# Patient Record
Sex: Female | Born: 1985 | Race: Black or African American | Hispanic: No | Marital: Single | Smoking: Never smoker
Health system: Southern US, Community
[De-identification: ages and names within clinical notes are randomized; demographics above are authoritative.]

## PROBLEM LIST (undated history)

## (undated) DIAGNOSIS — I10 Essential (primary) hypertension: Secondary | ICD-10-CM

## (undated) DIAGNOSIS — N390 Urinary tract infection, site not specified: Secondary | ICD-10-CM

## (undated) DIAGNOSIS — L309 Dermatitis, unspecified: Secondary | ICD-10-CM

## (undated) HISTORY — PX: FOOT SURGERY: SHX648

---

## 2004-05-11 HISTORY — PX: WISDOM TOOTH EXTRACTION: SHX21

## 2008-03-03 ENCOUNTER — Emergency Department (HOSPITAL_COMMUNITY): Admission: EM | Admit: 2008-03-03 | Discharge: 2008-03-03 | Payer: Self-pay | Admitting: Emergency Medicine

## 2011-02-10 LAB — DIFFERENTIAL
Basophils Absolute: 0.1
Lymphocytes Relative: 25
Neutro Abs: 5.8

## 2011-02-10 LAB — URINALYSIS, ROUTINE W REFLEX MICROSCOPIC
Leukocytes, UA: NEGATIVE
Nitrite: NEGATIVE
Specific Gravity, Urine: 1.04 — ABNORMAL HIGH
pH: 5.5

## 2011-02-10 LAB — CBC
Hemoglobin: 12.9
Platelets: 287
RDW: 13.8

## 2011-02-10 LAB — URINE MICROSCOPIC-ADD ON

## 2012-07-02 ENCOUNTER — Encounter (HOSPITAL_BASED_OUTPATIENT_CLINIC_OR_DEPARTMENT_OTHER): Payer: Self-pay | Admitting: Emergency Medicine

## 2012-07-02 ENCOUNTER — Emergency Department (HOSPITAL_BASED_OUTPATIENT_CLINIC_OR_DEPARTMENT_OTHER)
Admission: EM | Admit: 2012-07-02 | Discharge: 2012-07-02 | Disposition: A | Discharge status: Home / Self Care | Payer: 59 | Attending: Emergency Medicine | Admitting: Emergency Medicine

## 2012-07-02 DIAGNOSIS — R350 Frequency of micturition: Secondary | ICD-10-CM | POA: Insufficient documentation

## 2012-07-02 DIAGNOSIS — R109 Unspecified abdominal pain: Secondary | ICD-10-CM | POA: Insufficient documentation

## 2012-07-02 DIAGNOSIS — Z3202 Encounter for pregnancy test, result negative: Secondary | ICD-10-CM | POA: Insufficient documentation

## 2012-07-02 DIAGNOSIS — N390 Urinary tract infection, site not specified: Secondary | ICD-10-CM | POA: Insufficient documentation

## 2012-07-02 LAB — URINALYSIS, ROUTINE W REFLEX MICROSCOPIC
Bilirubin Urine: NEGATIVE
Glucose, UA: NEGATIVE mg/dL
Ketones, ur: 15 mg/dL — AB
pH: 5.5 (ref 5.0–8.0)

## 2012-07-02 LAB — URINE MICROSCOPIC-ADD ON

## 2012-07-02 MED ORDER — HYDROCODONE-ACETAMINOPHEN 5-325 MG PO TABS
2.0000 | ORAL_TABLET | Freq: Once | ORAL | Status: AC
  Administered 2012-07-02: 2 via ORAL
  Filled 2012-07-02: qty 2

## 2012-07-02 MED ORDER — CIPROFLOXACIN HCL 500 MG PO TABS: 500.0000 mg | ORAL_TABLET | Freq: Two times a day (BID) | ORAL | Status: DC

## 2012-07-02 MED ORDER — PHENAZOPYRIDINE HCL 200 MG PO TABS: 200.0000 mg | ORAL_TABLET | Freq: Three times a day (TID) | ORAL | Status: DC

## 2012-07-02 MED ORDER — HYDROCODONE-ACETAMINOPHEN 5-325 MG PO TABS: 2.0000 | ORAL_TABLET | Freq: Four times a day (QID) | ORAL | Status: DC | PRN

## 2012-07-02 NOTE — ED Provider Notes (Signed)
History     CSN: [id number]  Arrival date & time 07/02/12  1139   First MD Initiated Contact with Patient 07/02/12 1150      Chief Complaint  Patient presents with  . Dysuria  . Flank Pain    (Consider location/radiation/quality/duration/timing/severity/associated sxs/prior treatment) Patient is a 27 y.o. female presenting with dysuria. The history is provided by the patient.  Dysuria  This is a new problem. The current episode started 2 days ago. The problem occurs every urination. The problem has been gradually worsening. The quality of the pain is described as burning (frequency). The pain is moderate. There has been no fever. There is no history of pyelonephritis. Associated symptoms include frequency. Pertinent negatives include no chills, no vomiting, no discharge and no possible pregnancy. Treatments tried: azo otc.    No past medical history on file.  Past Surgical History  Procedure Laterality Date  . Foot surgery      No family history on file.  History  Substance Use Topics  . Smoking status: Not on file  . Smokeless tobacco: Not on file  . Alcohol Use: Not on file    OB History   Grav Para Term Preterm Abortions TAB SAB Ect Mult Living                  Review of Systems  Constitutional: Negative for chills.  Gastrointestinal: Negative for vomiting.  Genitourinary: Positive for dysuria and frequency.  All other systems reviewed and are negative.    Allergies  Penicillins  Home Medications   Current Outpatient Rx  Name  Route  Sig  Dispense  Refill  . phenazopyridine (PYRIDIUM) 95 MG tablet   Oral   Take 95 mg by mouth 3 (three) times daily as needed for pain.           BP 165/94  Pulse 95  Temp(Src) 97.9 F (36.6 C) (Oral)  Resp 18  Ht 5\' 5"  (1.651 m)  Wt 200 lb (90.719 kg)  BMI 33.28 kg/m2  SpO2 100%  LMP 06/20/2012  Physical Exam  Nursing note and vitals reviewed. Constitutional: She is oriented to person, place, and time.  She appears well-developed and well-nourished. No distress.  HENT:  Head: Normocephalic and atraumatic.  Mouth/Throat: Oropharynx is clear and moist.  Neck: Normal range of motion. Neck supple.  Cardiovascular: Normal rate and regular rhythm.   No murmur heard. Pulmonary/Chest: Effort normal and breath sounds normal. No respiratory distress. She has no wheezes.  Abdominal: Soft. Bowel sounds are normal. She exhibits no distension. There is no tenderness.  Musculoskeletal: Normal range of motion.  Lymphadenopathy:    She has no cervical adenopathy.  Neurological: She is alert and oriented to person, place, and time.  Skin: Skin is warm and dry. She is not diaphoretic.    ED Course  Procedures (including critical care time)  Labs Reviewed  URINALYSIS, ROUTINE W REFLEX MICROSCOPIC  PREGNANCY, URINE   No results found.   No diagnosis found.    MDM  The ua shows a uti.   This is consistent with the clinical picture.  Will discharge with cipro, return prn.        Geoffery Lyons, MD 07/02/12 1310

## 2012-07-02 NOTE — ED Notes (Signed)
Pt having some dysuria since Thursday.  Started taking azo-standard.  Symptoms relieved but is now having right flank pain.  No known fever.

## 2012-07-04 ENCOUNTER — Emergency Department (HOSPITAL_BASED_OUTPATIENT_CLINIC_OR_DEPARTMENT_OTHER)
Admission: EM | Admit: 2012-07-04 | Discharge: 2012-07-04 | Disposition: A | Discharge status: Home / Self Care | Payer: 59 | Attending: Emergency Medicine | Admitting: Emergency Medicine

## 2012-07-04 ENCOUNTER — Emergency Department (HOSPITAL_BASED_OUTPATIENT_CLINIC_OR_DEPARTMENT_OTHER): Payer: 59

## 2012-07-04 ENCOUNTER — Encounter (HOSPITAL_BASED_OUTPATIENT_CLINIC_OR_DEPARTMENT_OTHER): Payer: Self-pay | Admitting: Emergency Medicine

## 2012-07-04 DIAGNOSIS — Z79899 Other long term (current) drug therapy: Secondary | ICD-10-CM | POA: Insufficient documentation

## 2012-07-04 DIAGNOSIS — R109 Unspecified abdominal pain: Secondary | ICD-10-CM | POA: Insufficient documentation

## 2012-07-04 DIAGNOSIS — N760 Acute vaginitis: Secondary | ICD-10-CM | POA: Insufficient documentation

## 2012-07-04 DIAGNOSIS — A499 Bacterial infection, unspecified: Secondary | ICD-10-CM | POA: Insufficient documentation

## 2012-07-04 DIAGNOSIS — I10 Essential (primary) hypertension: Secondary | ICD-10-CM | POA: Insufficient documentation

## 2012-07-04 DIAGNOSIS — Z3202 Encounter for pregnancy test, result negative: Secondary | ICD-10-CM | POA: Insufficient documentation

## 2012-07-04 DIAGNOSIS — D259 Leiomyoma of uterus, unspecified: Secondary | ICD-10-CM | POA: Insufficient documentation

## 2012-07-04 DIAGNOSIS — N390 Urinary tract infection, site not specified: Secondary | ICD-10-CM | POA: Insufficient documentation

## 2012-07-04 DIAGNOSIS — Z872 Personal history of diseases of the skin and subcutaneous tissue: Secondary | ICD-10-CM | POA: Insufficient documentation

## 2012-07-04 DIAGNOSIS — B9689 Other specified bacterial agents as the cause of diseases classified elsewhere: Secondary | ICD-10-CM | POA: Insufficient documentation

## 2012-07-04 DIAGNOSIS — D219 Benign neoplasm of connective and other soft tissue, unspecified: Secondary | ICD-10-CM

## 2012-07-04 HISTORY — DX: Dermatitis, unspecified: L30.9

## 2012-07-04 HISTORY — DX: Essential (primary) hypertension: I10

## 2012-07-04 HISTORY — DX: Urinary tract infection, site not specified: N39.0

## 2012-07-04 LAB — WET PREP, GENITAL: Yeast Wet Prep HPF POC: NONE SEEN

## 2012-07-04 LAB — URINALYSIS, ROUTINE W REFLEX MICROSCOPIC
Leukocytes, UA: NEGATIVE
Nitrite: NEGATIVE
Specific Gravity, Urine: 1.011 (ref 1.005–1.030)
Urobilinogen, UA: 1 mg/dL (ref 0.0–1.0)

## 2012-07-04 LAB — URINE CULTURE

## 2012-07-04 LAB — PREGNANCY, URINE: Preg Test, Ur: NEGATIVE

## 2012-07-04 LAB — URINE MICROSCOPIC-ADD ON

## 2012-07-04 MED ORDER — IBUPROFEN 800 MG PO TABS: 800.0000 mg | ORAL_TABLET | Freq: Three times a day (TID) | ORAL | Status: DC

## 2012-07-04 MED ORDER — METRONIDAZOLE 500 MG PO TABS: 500.0000 mg | ORAL_TABLET | Freq: Two times a day (BID) | ORAL | Status: DC

## 2012-07-04 MED ORDER — HYDROCODONE-ACETAMINOPHEN 5-325 MG PO TABS: 2.0000 | ORAL_TABLET | ORAL | Status: DC | PRN

## 2012-07-04 NOTE — ED Notes (Signed)
Pt woke up at 7am and noticed vaginal spotting.  Got worse today until she was passing large clots. Using 1 pad per hour.  Had period 2 weeks ago. Cramps on left - worse than normal period cramps.

## 2012-07-04 NOTE — ED Provider Notes (Signed)
History     CSN: [id number]  Arrival date & time 07/04/12  1644   First MD Initiated Contact with Patient 07/04/12 1716      Chief Complaint  Patient presents with  . Vaginal Bleeding    (Consider location/radiation/quality/duration/timing/severity/associated sxs/prior treatment) Patient is a 27 y.o. female presenting with vaginal bleeding. The history is provided by the patient. No language interpreter was used.  Vaginal Bleeding This is a new problem. The current episode started today. The problem occurs constantly. The problem has been gradually worsening. Associated symptoms include abdominal pain. Nothing aggravates the symptoms.   Pt complains of lower abdominal cramping.   Pt reports her period was 2 weeks ago,   Pt reports she has been passing cramps today.  Pt reports period is not usually this heavy.   Pt denies any std risk Past Medical History  Diagnosis Date  . Hypertension   . UTI (lower urinary tract infection)   . Eczema     Past Surgical History  Procedure Laterality Date  . Foot surgery    . Foot surgery      No family history on file.  History  Substance Use Topics  . Smoking status: Never Smoker   . Smokeless tobacco: Not on file  . Alcohol Use: Yes     Comment: occ    OB History   Grav Para Term Preterm Abortions TAB SAB Ect Mult Living                  Review of Systems  Gastrointestinal: Positive for abdominal pain.  Genitourinary: Positive for vaginal bleeding.  All other systems reviewed and are negative.    Allergies  Penicillins  Home Medications   Current Outpatient Rx  Name  Route  Sig  Dispense  Refill  . norgestimate-ethinyl estradiol (ORTHO-CYCLEN,SPRINTEC,PREVIFEM) 0.25-35 MG-MCG tablet   Oral   Take 1 tablet by mouth daily.         . ciprofloxacin (CIPRO) 500 MG tablet   Oral   Take 1 tablet (500 mg total) by mouth every 12 (twelve) hours.   10 tablet   0   . HYDROcodone-acetaminophen (NORCO/VICODIN) 5-325 MG  per tablet   Oral   Take 2 tablets by mouth every 6 (six) hours as needed for pain.   12 tablet   0   . phenazopyridine (PYRIDIUM) 200 MG tablet   Oral   Take 1 tablet (200 mg total) by mouth 3 (three) times daily.   6 tablet   0   . phenazopyridine (PYRIDIUM) 95 MG tablet   Oral   Take 95 mg by mouth 3 (three) times daily as needed for pain.           BP 165/89  Pulse 94  Temp(Src) 98.2 F (36.8 C) (Oral)  Resp 16  Ht 5\' 5"  (1.651 m)  Wt 200 lb (90.719 kg)  BMI 33.28 kg/m2  SpO2 100%  LMP 06/22/2012  Physical Exam  Nursing note and vitals reviewed. Constitutional: She appears well-developed and well-nourished.  HENT:  Head: Normocephalic.  Right Ear: External ear normal.  Left Ear: External ear normal.  Nose: Nose normal.  Mouth/Throat: Oropharynx is clear and moist.  Eyes: Conjunctivae and EOM are normal. Pupils are equal, round, and reactive to light.  Neck: Normal range of motion. Neck supple.  Cardiovascular: Normal rate.   Pulmonary/Chest: Effort normal.  Abdominal: Soft.  Musculoskeletal: Normal range of motion.  Neurological: She is alert.  Skin: Skin is warm.  ED Course  Procedures (including critical care time)  Labs Reviewed  WET PREP, GENITAL - Abnormal; Notable for the following:    Clue Cells Wet Prep HPF POC FEW (*)    WBC, Wet Prep HPF POC RARE (*)    All other components within normal limits  URINALYSIS, ROUTINE W REFLEX MICROSCOPIC - Abnormal; Notable for the following:    Color, Urine AMBER (*)    Hgb urine dipstick LARGE (*)    All other components within normal limits  GC/CHLAMYDIA PROBE AMP  PREGNANCY, URINE  URINE MICROSCOPIC-ADD ON   US Transvaginal Non-ob  07/04/2012  *RADIOLOGY REPORT*  Clinical Data: Cramping and bleeding between periods, on birth control, LMP 06/24/2012  TRANSABDOMINAL AND TRANSVAGINAL ULTRASOUND OF PELVIS Technique:  Both transabdominal and transvaginal ultrasound examinations of the pelvis were  performed. Transabdominal technique was performed for global imaging of the pelvis including uterus, ovaries, adnexal regions, and pelvic cul-de-sac.  It was necessary to proceed with endovaginal exam following the transabdominal exam to visualize the endometrium and ovaries.  Comparison:  None  Findings:  Uterus: 8.1 cm length by 4.8 cm AP by 5.0 cm transverse.  Normal morphology with question of a smallsubserosal leiomyoma at the left lateral aspect of the uterus measuring 15 x 10 x 16 mm.  Endometrium: 2 mm thick, normal.  No endometrial fluid.  Right ovary:  3.5 x 1.3 x 1.6 cm.  Normal morphology without mass.  Left ovary: 2.1 x 1.5 x 1.3 cm.  Tiny left ovarian versus paraovarian cyst 8 x 8 x 8 mm.  Other findings: Trace free pelvic fluid.  A small nonspecific cyst is seen to the left posterior of the cervix, uncertain etiology, 2.4 x 1.4 x 2.0 cm.  IMPRESSION:  Probable tiny subserosal leiomyoma at left lateral aspect of uterus 16 mm greatest size. Unremarkable ovaries. Small cyst posterior to the cervix, 2.4 cm greatest size, nonspecific. Remainder of exam unremarkable.   Original Report Authenticated By: Ulyses Southward, M.D.    US Pelvis Complete  07/04/2012  *RADIOLOGY REPORT*  Clinical Data: Cramping and bleeding between periods, on birth control, LMP 06/24/2012  TRANSABDOMINAL AND TRANSVAGINAL ULTRASOUND OF PELVIS Technique:  Both transabdominal and transvaginal ultrasound examinations of the pelvis were performed. Transabdominal technique was performed for global imaging of the pelvis including uterus, ovaries, adnexal regions, and pelvic cul-de-sac.  It was necessary to proceed with endovaginal exam following the transabdominal exam to visualize the endometrium and ovaries.  Comparison:  None  Findings:  Uterus: 8.1 cm length by 4.8 cm AP by 5.0 cm transverse.  Normal morphology with question of a smallsubserosal leiomyoma at the left lateral aspect of the uterus measuring 15 x 10 x 16 mm.  Endometrium: 2  mm thick, normal.  No endometrial fluid.  Right ovary:  3.5 x 1.3 x 1.6 cm.  Normal morphology without mass.  Left ovary: 2.1 x 1.5 x 1.3 cm.  Tiny left ovarian versus paraovarian cyst 8 x 8 x 8 mm.  Other findings: Trace free pelvic fluid.  A small nonspecific cyst is seen to the left posterior of the cervix, uncertain etiology, 2.4 x 1.4 x 2.0 cm.  IMPRESSION:  Probable tiny subserosal leiomyoma at left lateral aspect of uterus 16 mm greatest size. Unremarkable ovaries. Small cyst posterior to the cervix, 2.4 cm greatest size, nonspecific. Remainder of exam unremarkable.   Original Report Authenticated By: Ulyses Southward, M.D.      No diagnosis found.    MDM   Pt  advised to follow up with her MD.    Pt given rx for flagyl and ibuprofen 800mg        Lonia Skinner [place], [place] 07/04/12 2017  Lonia Skinner [place], [place] 07/04/12 2022

## 2012-07-05 ENCOUNTER — Telehealth (HOSPITAL_COMMUNITY): Payer: Self-pay | Admitting: Emergency Medicine

## 2012-07-05 NOTE — ED Provider Notes (Signed)
Medical screening examination/treatment/procedure(s) were performed by non-physician practitioner and as supervising physician I was immediately available for consultation/collaboration.  Jaylene Arrowood, MD 07/05/12 0016 

## 2012-07-05 NOTE — ED Notes (Signed)
Results received from [facility]. (+) URNC -> >/= 100,000 colonies, E Coli. Rx given in ED for Cipro  -> sensitive to the same.  Chart appended per protocol.  

## 2013-03-08 ENCOUNTER — Emergency Department (HOSPITAL_BASED_OUTPATIENT_CLINIC_OR_DEPARTMENT_OTHER)
Admission: EM | Admit: 2013-03-08 | Discharge: 2013-03-08 | Disposition: A | Discharge status: Home / Self Care | Payer: 59 | Attending: Emergency Medicine | Admitting: Emergency Medicine

## 2013-03-08 ENCOUNTER — Encounter (HOSPITAL_BASED_OUTPATIENT_CLINIC_OR_DEPARTMENT_OTHER): Payer: Self-pay | Admitting: Emergency Medicine

## 2013-03-08 DIAGNOSIS — Z87440 Personal history of urinary (tract) infections: Secondary | ICD-10-CM | POA: Insufficient documentation

## 2013-03-08 DIAGNOSIS — K029 Dental caries, unspecified: Secondary | ICD-10-CM

## 2013-03-08 DIAGNOSIS — Z872 Personal history of diseases of the skin and subcutaneous tissue: Secondary | ICD-10-CM | POA: Insufficient documentation

## 2013-03-08 DIAGNOSIS — K089 Disorder of teeth and supporting structures, unspecified: Secondary | ICD-10-CM | POA: Insufficient documentation

## 2013-03-08 DIAGNOSIS — I10 Essential (primary) hypertension: Secondary | ICD-10-CM | POA: Insufficient documentation

## 2013-03-08 DIAGNOSIS — Z880 Allergy status to penicillin: Secondary | ICD-10-CM | POA: Insufficient documentation

## 2013-03-08 DIAGNOSIS — Z79899 Other long term (current) drug therapy: Secondary | ICD-10-CM | POA: Insufficient documentation

## 2013-03-08 MED ORDER — NAPROXEN 250 MG PO TABS: 500.0000 mg | ORAL_TABLET | Freq: Once | ORAL | Status: DC

## 2013-03-08 MED ORDER — CLINDAMYCIN HCL 150 MG PO CAPS: 300.0000 mg | ORAL_CAPSULE | Freq: Once | ORAL | Status: DC

## 2013-03-08 MED ORDER — HYDROCODONE-ACETAMINOPHEN 5-325 MG PO TABS: 1.0000 | ORAL_TABLET | Freq: Four times a day (QID) | ORAL | Status: DC | PRN

## 2013-03-08 MED ORDER — CLINDAMYCIN HCL 150 MG PO CAPS: 150.0000 mg | ORAL_CAPSULE | Freq: Three times a day (TID) | ORAL | Status: DC

## 2013-03-08 NOTE — ED Notes (Signed)
Right upper dental pain for 2 weeks.  Pt denies fever.  OTC remedies not working.

## 2013-03-08 NOTE — ED Provider Notes (Signed)
CSN: [id number]     Arrival date & time 03/08/13  0610 History   First MD Initiated Contact with Patient 03/08/13 [phone]     Chief Complaint  Patient presents with  . Dental Pain   (Consider location/radiation/quality/duration/timing/severity/associated sxs/prior Treatment) HPI This is a 27 year old female who fractured her right upper first molar "years ago". She never had this dull with. She has had pain in that tooth for about the last week which acutely worsened yesterday. The pain is moderate to severe radiates to the right side of her face. It is not worse with eating or drinking and is constant. There is no associated lymphadenopathy. There is no associated facial swelling. She has an appointment with her dentist in a week. She has tried Tylenol, Anbesol and mouthwash without relief.  Past Medical History  Diagnosis Date  . Hypertension   . UTI (lower urinary tract infection)   . Eczema    Past Surgical History  Procedure Laterality Date  . Foot surgery    . Foot surgery     History reviewed. No pertinent family history. History  Substance Use Topics  . Smoking status: Never Smoker   . Smokeless tobacco: Not on file  . Alcohol Use: Yes     Comment: occ   OB History   Grav Para Term Preterm Abortions TAB SAB Ect Mult Living                 Review of Systems  All other systems reviewed and are negative.    Allergies  Penicillins  Home Medications   Current Outpatient Rx  Name  Route  Sig  Dispense  Refill  . norgestimate-ethinyl estradiol (ORTHO-CYCLEN,SPRINTEC,PREVIFEM) 0.25-35 MG-MCG tablet   Oral   Take 1 tablet by mouth daily.          BP 148/90  Pulse 82  Temp(Src) 98.5 F (36.9 C) (Oral)  Resp 16  Ht 5\' 5"  (1.651 m)  Wt 200 lb (90.719 kg)  BMI 33.28 kg/m2  SpO2 100%  LMP 02/22/2013  Physical Exam General: Well-developed, well-nourished female in no acute distress; appearance consistent with age of record HENT: normocephalic; atraumatic; no  facial swelling; fractured and carious right upper first molar with tenderness to percussion Eyes: pupils equal, round and reactive to light; extraocular muscles intact Neck: supple; no lymphadenopathy Heart: regular rate and rhythm Lungs: clear to auscultation bilaterally Abdomen: soft; nondistended Extremities: No deformity; full range of motion Neurologic: Awake, alert and oriented; motor function intact in all extremities and symmetric; no facial droop Skin: Warm and dry Psychiatric: Normal mood and affect    ED Course  Procedures (including critical care time)  MDM      Hanley Seamen, MD 03/08/13 [phone]

## 2013-04-30 ENCOUNTER — Encounter (HOSPITAL_BASED_OUTPATIENT_CLINIC_OR_DEPARTMENT_OTHER): Payer: Self-pay | Admitting: Emergency Medicine

## 2013-04-30 ENCOUNTER — Inpatient Hospital Stay (HOSPITAL_BASED_OUTPATIENT_CLINIC_OR_DEPARTMENT_OTHER)
Admission: EM | Admit: 2013-04-30 | Discharge: 2013-05-01 | Disposition: A | Discharge status: Other Acute Inpatient Hospital | Payer: BC Managed Care – PPO | Attending: Obstetrics & Gynecology | Admitting: Obstetrics & Gynecology

## 2013-04-30 DIAGNOSIS — O2390 Unspecified genitourinary tract infection in pregnancy, unspecified trimester: Secondary | ICD-10-CM | POA: Insufficient documentation

## 2013-04-30 DIAGNOSIS — Z880 Allergy status to penicillin: Secondary | ICD-10-CM | POA: Insufficient documentation

## 2013-04-30 DIAGNOSIS — Z3490 Encounter for supervision of normal pregnancy, unspecified, unspecified trimester: Secondary | ICD-10-CM

## 2013-04-30 DIAGNOSIS — N939 Abnormal uterine and vaginal bleeding, unspecified: Secondary | ICD-10-CM

## 2013-04-30 DIAGNOSIS — B9689 Other specified bacterial agents as the cause of diseases classified elsewhere: Secondary | ICD-10-CM

## 2013-04-30 DIAGNOSIS — O200 Threatened abortion: Secondary | ICD-10-CM

## 2013-04-30 DIAGNOSIS — O209 Hemorrhage in early pregnancy, unspecified: Secondary | ICD-10-CM | POA: Insufficient documentation

## 2013-04-30 DIAGNOSIS — N760 Acute vaginitis: Secondary | ICD-10-CM | POA: Insufficient documentation

## 2013-04-30 DIAGNOSIS — N898 Other specified noninflammatory disorders of vagina: Secondary | ICD-10-CM

## 2013-04-30 DIAGNOSIS — Z872 Personal history of diseases of the skin and subcutaneous tissue: Secondary | ICD-10-CM | POA: Insufficient documentation

## 2013-04-30 DIAGNOSIS — O10919 Unspecified pre-existing hypertension complicating pregnancy, unspecified trimester: Secondary | ICD-10-CM | POA: Insufficient documentation

## 2013-04-30 DIAGNOSIS — Z792 Long term (current) use of antibiotics: Secondary | ICD-10-CM | POA: Insufficient documentation

## 2013-04-30 DIAGNOSIS — Z79899 Other long term (current) drug therapy: Secondary | ICD-10-CM | POA: Insufficient documentation

## 2013-04-30 LAB — WET PREP, GENITAL
Trich, Wet Prep: NONE SEEN
Yeast Wet Prep HPF POC: NONE SEEN

## 2013-04-30 LAB — URINALYSIS, ROUTINE W REFLEX MICROSCOPIC
Bilirubin Urine: NEGATIVE
Glucose, UA: NEGATIVE mg/dL
Ketones, ur: 15 mg/dL — AB
Leukocytes, UA: NEGATIVE
Nitrite: NEGATIVE
Specific Gravity, Urine: 1.03 (ref 1.005–1.030)
pH: 6 (ref 5.0–8.0)

## 2013-04-30 LAB — ABO/RH: ABO/RH(D): A POS

## 2013-04-30 LAB — URINE MICROSCOPIC-ADD ON

## 2013-04-30 LAB — CBC
HCT: 34 % — ABNORMAL LOW (ref 36.0–46.0)
Hemoglobin: 11 g/dL — ABNORMAL LOW (ref 12.0–15.0)
MCV: 74.6 fL — ABNORMAL LOW (ref 78.0–100.0)
RBC: 4.56 MIL/uL (ref 3.87–5.11)
RDW: 17.8 % — ABNORMAL HIGH (ref 11.5–15.5)
WBC: 6.7 10*3/uL (ref 4.0–10.5)

## 2013-04-30 LAB — HCG, QUANTITATIVE, PREGNANCY: hCG, Beta Chain, Quant, S: 19788 m[IU]/mL — ABNORMAL HIGH (ref <5)

## 2013-04-30 MED ORDER — METRONIDAZOLE 500 MG PO TABS: 500.0000 mg | ORAL_TABLET | Freq: Two times a day (BID) | ORAL | Status: DC

## 2013-04-30 NOTE — ED Notes (Signed)
Patient going to [facility] for an ultrasound

## 2013-04-30 NOTE — MAU Note (Signed)
Reports vag bleeding like a period and pregnant 8-9 weeks per pt.

## 2013-04-30 NOTE — ED Provider Notes (Signed)
This chart was scribed for Jamie Maw Nasim Habeeb, DO by Arlan Organ, ED Scribe. This patient was seen in room MH10/MH10 and the patient's care was started 7:30 PM.   TIME SEEN: 7:30 PM   CHIEF COMPLAINT: Vaginal Bleeding  HPI:   HPI Comments: Jamie Greene is a 27 y.o. Female G2P0A1 currently 9 weeks and 4 days pregnant by LMP with a h/o HTN who presents to the Emergency Department complaining of gradual onset, unchanged, intermittent mild vaginal bleeding that initially started 4 days ago. She lists abdominal pain, mild milky discharge, and nausea as associated symptoms. Pt reports bright red bleeding consisting of "clots" today around 5 PM. Pt states she bled enough to "fill a  Pad". She denies dysuria, vomiting, or diarrhea. Her LMP 02/22/13. She states she has been pregnant in the past, but says she had an abortion in 2006. She currently is sexually active with 1 partner. She denies currently being on any blood thinners. No h/o trauma.  No prior abd surgeries.  No dysuria or hematuria.  She is followed by [facility]  ROS: See HPI Constitutional: no fever  Eyes: no drainage  ENT: no runny nose   Cardiovascular:  no chest pain  Resp: no SOB  GI: no vomiting GU: no dysuria, positive for vaginal bleeding, positive for vaginal discharge Integumentary: no rash  Allergy: no hives  Musculoskeletal: no leg swelling  Neurological: no slurred speech ROS otherwise negative  PAST MEDICAL HISTORY/PAST SURGICAL HISTORY:  Past Medical History  Diagnosis Date  . Hypertension   . UTI (lower urinary tract infection)   . Eczema     MEDICATIONS:  Prior to Admission medications   Medication Sig Start Date End Date Taking? Authorizing Provider  clindamycin (CLEOCIN) 150 MG capsule Take 1 capsule (150 mg total) by mouth 3 (three) times daily. 03/08/13   Carlisle Beers Molpus, MD  HYDROcodone-acetaminophen (NORCO/VICODIN) 5-325 MG per tablet Take 1-2 tablets by mouth every 6 (six) hours as needed for  pain. 03/08/13   Carlisle Beers Molpus, MD  norgestimate-ethinyl estradiol (ORTHO-CYCLEN,SPRINTEC,PREVIFEM) 0.25-35 MG-MCG tablet Take 1 tablet by mouth daily.    Historical Provider, MD    ALLERGIES:  Allergies  Allergen Reactions  . Penicillins Hives    SOCIAL HISTORY:  History  Substance Use Topics  . Smoking status: Never Smoker   . Smokeless tobacco: Never Used  . Alcohol Use: Yes     Comment: occ    FAMILY HISTORY: No family history on file.  EXAM: BP 146/82  Pulse 93  Temp(Src) 98.7 F (37.1 C) (Oral)  Resp 14  Wt 215 lb (97.523 kg)  SpO2 100%  LMP 02/27/2013 CONSTITUTIONAL: Alert and oriented and responds appropriately to questions. Well-appearing; well-nourished; smiling, NAD HEAD: Normocephalic EYES: Conjunctivae clear, PERRL ENT: normal nose; no rhinorrhea; moist mucous membranes; pharynx without lesions noted NECK: Supple, no meningismus, no LAD  CARD: RRR; S1 and S2 appreciated; no murmurs, no clicks, no rubs, no gallops RESP: Normal chest excursion without splinting or tachypnea; breath sounds clear and equal bilaterally; no wheezes, no rhonchi, no rales,  ABD/GI: Normal bowel sounds; non-distended; soft, non-tender, no rebound, no guarding GU: Normal external genitalia; moderate amount of dark blood from cervical os, mild right adnexal tenderness without fullness, no left adnexal tenderness of fullness; cervical os closed BACK:  The back appears normal and is non-tender to palpation, there is no CVA tenderness EXT: Normal ROM in all joints; non-tender to palpation; no edema; normal capillary refill; no cyanosis  SKIN: Normal color for age and race; warm NEURO: Moves all extremities equally PSYCH: The patient's mood and manner are appropriate. Grooming and personal hygiene are appropriate.  MEDICAL DECISION MAKING: Patient is hemoglobin of 11.0. Her beta hCG is 19778. Her urine shows large hemoglobin which is likely from her vaginal bleeding. She has a clue cells  on wet prep. Will treat with Flagyl. On bedside abdominal ultrasound I am unable to visualize a intrauterine pregnancy, there is a gestational salk but no yolk sack or fetus.  Unable to visualize adnexa.  [facility] for possible ectopic given her right adnexal tenderness and no IUP seen on [place].  ED PROGRESS: Spoke with Shawna Orleans with [facility].  Pt has not established care in their office for this pregnancy so she is not their pt.  Spoke with Dr. Debroah Loop with OB/GYN at [facility] who agrees to accept patient for transvaginal ultrasound. Patient to come over by private vehicle.  Pt is hemodynamically stable.  [facility]   I personally performed the services described in this documentation, which was scribed in my presence. The recorded information has been reviewed and is accurate.   Jamie Maw Janasha Barkalow, DO 05/01/13 1246

## 2013-04-30 NOTE — ED Notes (Signed)
Reports she approx [redacted] weeks pregnant- has been noticing light spotting since Wednesday- today at 5pm had bright red bleeding with clots, enough to fill a pad

## 2013-05-01 ENCOUNTER — Inpatient Hospital Stay (HOSPITAL_COMMUNITY): Payer: BC Managed Care – PPO

## 2013-05-01 ENCOUNTER — Encounter (HOSPITAL_COMMUNITY): Payer: Self-pay

## 2013-05-01 LAB — GC/CHLAMYDIA PROBE AMP: CT Probe RNA: NEGATIVE

## 2013-05-01 MED ORDER — PROMETHAZINE HCL 25 MG PO TABS: 25.0000 mg | ORAL_TABLET | Freq: Four times a day (QID) | ORAL | Status: AC | PRN

## 2013-05-01 MED ORDER — OXYCODONE-ACETAMINOPHEN 5-325 MG PO TABS: 2.0000 | ORAL_TABLET | ORAL | Status: DC | PRN

## 2013-05-01 MED ORDER — PROMETHAZINE HCL 25 MG PO TABS
25.0000 mg | ORAL_TABLET | Freq: Once | ORAL | Status: AC
  Administered 2013-05-01: 25 mg via ORAL
  Filled 2013-05-01: qty 1

## 2013-05-01 MED ORDER — OXYCODONE-ACETAMINOPHEN 5-325 MG PO TABS
1.0000 | ORAL_TABLET | Freq: Once | ORAL | Status: AC
  Administered 2013-05-01: 1 via ORAL
  Filled 2013-05-01: qty 1

## 2013-05-01 NOTE — MAU Provider Note (Signed)
History     CSN: [id number]  Arrival date and time: 04/30/13 6578   First Provider Initiated Contact with Patient 05/01/13 0012      Chief Complaint  Patient presents with  . Vaginal Bleeding    8 wks preg   HPI Comments: Jamie Greene 27 y.o. G2P0010 presents to MAU from [facility] where she went today for vaginal bleeding in early pregnancy. She is [redacted] weeks pregnant. She started spotting 4 days ago and today was bleeding like a menses. She had had 3 large pads on today. She was seen and examined at [facility] and her quant is 19,778.     Vaginal Bleeding      Past Medical History  Diagnosis Date  . Hypertension   . UTI (lower urinary tract infection)   . Eczema     Past Surgical History  Procedure Laterality Date  . Foot surgery    . Foot surgery    . Wisdom tooth extraction  2006    Family History  Problem Relation Age of Onset  . Cancer Mother   . Diabetes Maternal Grandmother   . Diabetes Maternal Grandfather     History  Substance Use Topics  . Smoking status: Never Smoker   . Smokeless tobacco: Never Used  . Alcohol Use: No     Comment: occas use before pregnancy    Allergies:  Allergies  Allergen Reactions  . Penicillins Hives    Prescriptions prior to admission  Medication Sig Dispense Refill  . clindamycin (CLEOCIN) 150 MG capsule Take 1 capsule (150 mg total) by mouth 3 (three) times daily.  20 capsule  0  . HYDROcodone-acetaminophen (NORCO/VICODIN) 5-325 MG per tablet Take 1-2 tablets by mouth every 6 (six) hours as needed for pain.  20 tablet  0  . norgestimate-ethinyl estradiol (ORTHO-CYCLEN,SPRINTEC,PREVIFEM) 0.25-35 MG-MCG tablet Take 1 tablet by mouth daily.        Review of Systems  Genitourinary: Positive for vaginal bleeding.   Physical Exam   Blood pressure 153/78, pulse 88, temperature 98.1 F (36.7 C), temperature source Oral, resp. rate 18, weight 97.523 kg (215 lb), last menstrual period 02/27/2013, SpO2  100.00%.  Physical Exam  Constitutional:  Exam was done at [facility]   Results for orders placed during the hospital encounter of 04/30/13 (from the past 24 hour(s))  CBC     Status: Abnormal   Collection Time    04/30/13  8:00 PM      Result Value Range   WBC 6.7  4.0 - 10.5 K/uL   RBC 4.56  3.87 - 5.11 MIL/uL   Hemoglobin 11.0 (*) 12.0 - 15.0 g/dL   HCT 46.9 (*) 62.9 - 52.8 %   MCV 74.6 (*) 78.0 - 100.0 fL   MCH 24.1 (*) 26.0 - 34.0 pg   MCHC 32.4  30.0 - 36.0 g/dL   RDW 41.3 (*) 24.4 - 01.0 %   Platelets 307  150 - 400 K/uL  HCG, QUANTITATIVE, PREGNANCY     Status: Abnormal   Collection Time    04/30/13  8:00 PM      Result Value Range   hCG, Beta Chain, Sharene Butters, [place] [address] (*) <5 mIU/mL  ABO/RH     Status: None   Collection Time    04/30/13  8:00 PM      Result Value Range   ABO/RH(D) A POS     No rh immune globuloin       Value:  NOT A RH IMMUNE GLOBULIN CANDIDATE, PT RH POSITIVE     Performed at [facility]  WET PREP, GENITAL     Status: Abnormal   Collection Time    04/30/13  8:06 PM      Result Value Range   Yeast Wet Prep HPF POC NONE SEEN  NONE SEEN   Trich, Wet Prep NONE SEEN  NONE SEEN   Clue Cells Wet Prep HPF POC FEW (*) NONE SEEN   WBC, Wet Prep HPF POC FEW (*) NONE SEEN  URINALYSIS, ROUTINE W REFLEX MICROSCOPIC     Status: Abnormal   Collection Time    04/30/13  8:42 PM      Result Value Range   Color, Urine YELLOW  YELLOW   APPearance CLOUDY (*) CLEAR   Specific Gravity, Urine 1.030  1.005 - 1.030   pH 6.0  5.0 - 8.0   Glucose, UA NEGATIVE  NEGATIVE mg/dL   Hgb urine dipstick LARGE (*) NEGATIVE   Bilirubin Urine NEGATIVE  NEGATIVE   Ketones, ur 15 (*) NEGATIVE mg/dL   Protein, ur NEGATIVE  NEGATIVE mg/dL   Urobilinogen, UA 1.0  0.0 - 1.0 mg/dL   Nitrite NEGATIVE  NEGATIVE   Leukocytes, UA NEGATIVE  NEGATIVE  URINE MICROSCOPIC-ADD ON     Status: Abnormal   Collection Time    04/30/13  8:42 PM      Result Value Range   Squamous  Epithelial / LPF FEW (*) RARE   RBC / HPF 21-50  <3 RBC/hpf   Bacteria, UA MANY (*) RARE   Crystals CA OXALATE CRYSTALS (*) NEGATIVE   Urine-Other MUCOUS PRESENT     US Ob Comp Less 14 Wks  05/01/2013   CLINICAL DATA:  Vaginal bleeding for 4 days. Right lower quadrant abdominal pain.  EXAM: OBSTETRIC <14 WK [place] AND TRANSVAGINAL OB US  TECHNIQUE: Both transabdominal and transvaginal ultrasound examinations were performed for complete evaluation of the gestation as well as the maternal uterus, adnexal regions, and pelvic cul-de-sac. Transvaginal technique was performed to assess early pregnancy.  COMPARISON:  Pelvic ultrasound performed 07/04/2012  FINDINGS: Intrauterine gestational sac: Visualized / irregular in shape, and containing debris.  Yolk sac:  No  Embryo:  No  Cardiac Activity: N/A  MSD: 19.7 mm   7 w  0 d  Maternal uterus/adnexae: Several small fibroids are noted, measuring up to 1.8 cm in size, on both sides of the uterus. No subchorionic hemorrhage is seen. The uterus is otherwise unremarkable in appearance.  The ovaries are within normal limits. The right ovary measures 2.2 x 1.8 x 2.1 cm, while the left ovary measures 2.9 x 2.2 x 2.4 cm. No suspicious adnexal masses are seen; there is no evidence for ovarian torsion.  A small amount of free fluid is seen within the pelvic cul-de-sac.  IMPRESSION: 1. Single intrauterine gestational sac noted, with a mean sac diameter of 2.0 cm, corresponding to a gestational age of [redacted] weeks 0 days. This does not match the gestational age by LMP. The gestational sac is irregular in appearance, and contains debris, raising question for a blighted ovum. Would follow-up quantitative HCG levels. No yolk sac or embryo is yet seen. 2. Several small uterine fibroids noted.   Electronically Signed   By: Roanna Raider M.D.   On: 05/01/2013 02:04   US Ob Transvaginal  05/01/2013   CLINICAL DATA:  Vaginal bleeding for 4 days. Right lower quadrant abdominal pain.  EXAM:  OBSTETRIC <14  WK [place] AND TRANSVAGINAL OB US  TECHNIQUE: Both transabdominal and transvaginal ultrasound examinations were performed for complete evaluation of the gestation as well as the maternal uterus, adnexal regions, and pelvic cul-de-sac. Transvaginal technique was performed to assess early pregnancy.  COMPARISON:  Pelvic ultrasound performed 07/04/2012  FINDINGS: Intrauterine gestational sac: Visualized / irregular in shape, and containing debris.  Yolk sac:  No  Embryo:  No  Cardiac Activity: N/A  MSD: 19.7 mm   7 w  0 d  Maternal uterus/adnexae: Several small fibroids are noted, measuring up to 1.8 cm in size, on both sides of the uterus. No subchorionic hemorrhage is seen. The uterus is otherwise unremarkable in appearance.  The ovaries are within normal limits. The right ovary measures 2.2 x 1.8 x 2.1 cm, while the left ovary measures 2.9 x 2.2 x 2.4 cm. No suspicious adnexal masses are seen; there is no evidence for ovarian torsion.  A small amount of free fluid is seen within the pelvic cul-de-sac.  IMPRESSION: 1. Single intrauterine gestational sac noted, with a mean sac diameter of 2.0 cm, corresponding to a gestational age of [redacted] weeks 0 days. This does not match the gestational age by LMP. The gestational sac is irregular in appearance, and contains debris, raising question for a blighted ovum. Would follow-up quantitative HCG levels. No yolk sac or embryo is yet seen. 2. Several small uterine fibroids noted.   Electronically Signed   By: Roanna Raider M.D.   On: 05/01/2013 02:04    MAU Course  Procedures  MDM  Labs and ultrasounds  Assessment and Plan   A: Threatened miscarriage  P: Percocet/ phenergan for pain Follow up quant Tuesday evening Discussed options including D&C, Cytotec and expectant management after next Quant Return to MAU as needed  Carolynn Serve 05/01/2013, 12:19 AM

## 2013-05-02 ENCOUNTER — Inpatient Hospital Stay (HOSPITAL_COMMUNITY)
Admission: AD | Admit: 2013-05-02 | Discharge: 2013-05-03 | Disposition: A | Discharge status: Home / Self Care | Payer: BC Managed Care – PPO | Source: Ambulatory Visit | Attending: Obstetrics & Gynecology | Admitting: Obstetrics & Gynecology

## 2013-05-02 DIAGNOSIS — O039 Complete or unspecified spontaneous abortion without complication: Secondary | ICD-10-CM

## 2013-05-02 DIAGNOSIS — O0289 Other abnormal products of conception: Secondary | ICD-10-CM | POA: Insufficient documentation

## 2013-05-02 DIAGNOSIS — R109 Unspecified abdominal pain: Secondary | ICD-10-CM | POA: Insufficient documentation

## 2013-05-02 LAB — CBC
HCT: 31.7 % — ABNORMAL LOW (ref 36.0–46.0)
Hemoglobin: 10.4 g/dL — ABNORMAL LOW (ref 12.0–15.0)
MCHC: 32.8 g/dL (ref 30.0–36.0)
MCV: 74.2 fL — ABNORMAL LOW (ref 78.0–100.0)
Platelets: 307 10*3/uL (ref 150–400)
RDW: 18.1 % — ABNORMAL HIGH (ref 11.5–15.5)

## 2013-05-02 LAB — HCG, QUANTITATIVE, PREGNANCY: hCG, Beta Chain, Quant, S: 14393 m[IU]/mL — ABNORMAL HIGH (ref <5)

## 2013-05-02 MED ORDER — MISOPROSTOL 200 MCG PO TABS: ORAL_TABLET | ORAL | Status: DC

## 2013-05-02 MED ORDER — OXYCODONE-ACETAMINOPHEN 5-325 MG PO TABS: 1.0000 | ORAL_TABLET | ORAL | Status: DC | PRN

## 2013-05-02 MED ORDER — IBUPROFEN 800 MG PO TABS: 800.0000 mg | ORAL_TABLET | Freq: Three times a day (TID) | ORAL | Status: DC

## 2013-05-02 NOTE — MAU Note (Signed)
Pt in for follow up BHCG, having mild cramping with small amt of dark bleeding.

## 2013-05-02 NOTE — MAU Provider Note (Signed)
Ms. Jamie Greene is a 27 y.o. G2P0010 at [redacted]w[redacted]d who presents to MAU today for follow-up quant hCG. Patient continues to have light bleeding and mild cramping. She denies fever.   BP 151/84  Pulse 92  Temp(Src) 98.5 F (36.9 C) (Oral)  Resp 16  LMP 02/27/2013 GENERAL: Well-developed, well-nourished female in no acute distress.  HEENT: Normocephalic, atraumatic.   LUNGS: Effort normal HEART: Regular rate  SKIN: Warm, dry and without erythema PSYCH: Normal mood and affect  Results for Jamie Greene, Jamie Greene (MRN [id number]) as of 05/03/2013 00:44  Ref. Range 07/04/2012 17:59 07/04/2012 19:43 04/30/2013 20:00 04/30/2013 20:06 04/30/2013 20:42 05/01/2013 01:41 05/02/2013 22:26 05/02/2013 22:30  hCG, Beta Chain, Quant, S Latest Range: <5 mIU/mL   19788 (H)    14393 (H)    MDM Discussed with Dr. Macon Large. Ok to offer Cytotec vs Expectant management at this time Discussed options at length with the patient. Patient would like to try Cytotec at this time.   A: Blighted Ovum  P: Discharge home Rx for Cytotec, Percocet, Phenergan and Ibuprofen given to the patient Bleeding precautions discussed Patient referred to [facility] clinic for follow-up in ~ 2 weeks. They will call her with an appointment Patient may return to MAU as needed or if her condition were to change or worsen  Freddi Starr, PA-C 05/03/2013 12:46 AM

## 2013-05-03 ENCOUNTER — Encounter: Payer: Self-pay | Admitting: Advanced Practice Midwife

## 2013-05-08 NOTE — MAU Provider Note (Signed)
Attestation of Attending Supervision of Advanced Practitioner (PA/CNM/NP): Evaluation and management procedures were performed by the Advanced Practitioner under my supervision and collaboration.  I have reviewed the Advanced Practitioner's note and chart, and I agree with the management and plan.  Papa Piercefield, MD, FACOG Attending Obstetrician & Gynecologist Faculty Practice, [facility]  

## 2013-05-15 ENCOUNTER — Telehealth: Payer: Self-pay | Admitting: *Deleted

## 2013-05-15 NOTE — Telephone Encounter (Signed)
Call from patient requesting to schedule NGYN for annual exam. Recently had SAB at [facility] and was referred to [place] to establish care. Appt scheduled. She states all info regarding SAB was at [facility] so records should be available. Just wanted to send you this for you to be able to review notes.  Routing to provider for final review. Patient agreeable to disposition. Will close encounter

## 2013-05-17 ENCOUNTER — Ambulatory Visit (INDEPENDENT_AMBULATORY_CARE_PROVIDER_SITE_OTHER): Payer: BC Managed Care – PPO | Admitting: Advanced Practice Midwife

## 2013-05-17 ENCOUNTER — Encounter: Payer: Self-pay | Admitting: Nurse Practitioner

## 2013-05-17 ENCOUNTER — Encounter: Payer: Self-pay | Admitting: Advanced Practice Midwife

## 2013-05-17 VITALS — BP 151/90 | HR 102 | Temp 98.7°F | Resp 20 | Ht 65.0 in | Wt 218.2 lb

## 2013-05-17 DIAGNOSIS — O020 Blighted ovum and nonhydatidiform mole: Secondary | ICD-10-CM

## 2013-05-17 DIAGNOSIS — O0289 Other abnormal products of conception: Secondary | ICD-10-CM

## 2013-05-17 DIAGNOSIS — O039 Complete or unspecified spontaneous abortion without complication: Secondary | ICD-10-CM

## 2013-05-17 DIAGNOSIS — N898 Other specified noninflammatory disorders of vagina: Secondary | ICD-10-CM

## 2013-05-17 DIAGNOSIS — N939 Abnormal uterine and vaginal bleeding, unspecified: Secondary | ICD-10-CM

## 2013-05-17 MED ORDER — IBUPROFEN 800 MG PO TABS: 800.0000 mg | ORAL_TABLET | Freq: Three times a day (TID) | ORAL | Status: DC

## 2013-05-17 NOTE — Progress Notes (Signed)
S: 28 y.o. G2P0020 presents to clinic for f/u appointment related to recent SAB and Cytotec placed vaginally on 05/03/13.  Following Cytotec placement, she had 2-3 days of heavy bleeding with clots, followed by lighter bleeding, with small occasional clots daily x 3 weeks.  Today she reports light cramping also.  She denies dizziness, n/v, h/a, or fever/chills.   O: BP 151/90  Pulse 102  Temp(Src) 98.7 F (37.1 C) (Oral)  Resp 20  Ht 5\' 5"  (1.651 m)  Wt 218 lb 3.2 oz (98.975 kg)  BMI 36.31 kg/m2  LMP 02/27/2013  Breastfeeding? Unknown  Physical Examination: General appearance - alert, well appearing, and in no distress and oriented to person, place, and time  Quant hcg results:  04/30/13: 02542 05/02/13: 70623  A: SAB S/P Cytotec on 05/03/13  P: Quant hcg today F/U dependent upon quant hcg Ibuprofen 800 mg prescription renewed  Fatima Blank Certified Nurse-Midwife

## 2013-05-17 NOTE — Progress Notes (Signed)
Pt states she placed the Cytotec on 05/03/13. She had some heavy bleeding and clots on 12/25-12/26 and also has continued to have some bleeding every Jamie Greene. She states that she uses 5 pads per Jamie Greene.

## 2013-05-17 NOTE — Addendum Note (Signed)
Addended by: Shelly Coss on: 05/17/2013 03:48 PM   Modules accepted: Orders

## 2013-05-18 LAB — HCG, QUANTITATIVE, PREGNANCY: HCG, BETA CHAIN, QUANT, S: 602.4 m[IU]/mL

## 2013-05-22 ENCOUNTER — Telehealth: Payer: Self-pay

## 2013-05-22 NOTE — Telephone Encounter (Signed)
Message copied by Michel Harrow on Mon May 22, 2013  8:27 AM ------      Message from: Fatima Blank A      Created: Sat May 20, 2013  5:58 AM       Pt quant results significantly lower than 2 weeks ago.  She needs f/u visit for lab only in clinic in 1 week for quant hcg.  Please make appointment and call to let her know.  Thank you. ------

## 2013-05-22 NOTE — Telephone Encounter (Signed)
Called pt and informed pt that her quant levels are going down like they should but the provider would like for her to come in for another quant level.  Pt stated "ok, I will be able to come in on Friday 05/26/13 @ 0800 for the lab."

## 2013-05-26 ENCOUNTER — Other Ambulatory Visit: Payer: BC Managed Care – PPO

## 2013-05-26 DIAGNOSIS — O039 Complete or unspecified spontaneous abortion without complication: Secondary | ICD-10-CM

## 2013-05-27 LAB — HCG, QUANTITATIVE, PREGNANCY: hCG, Beta Chain, Quant, S: 39.1 m[IU]/mL

## 2013-05-29 ENCOUNTER — Telehealth: Payer: Self-pay | Admitting: *Deleted

## 2013-05-29 NOTE — Telephone Encounter (Addendum)
Message copied by Langston Reusing on Mon May 29, 2013 11:49 AM ------      Message from: Verita Schneiders A      Created: Sat May 27, 2013  9:44 AM       Return in 2 weeks with any provider for follow up visit and repeat BHCG. Please call to inform patient of results and recommendations.       ------Called pt and informed her that her pregnancy hormone level has declined nicely but will need to be checked again in 2 weeks. It is also advised that she meet with the doctor again for follow up care. An appt has been made for her on 06/09/13 @ 0915. Pt agreed to appt and voiced understanding.

## 2013-06-07 ENCOUNTER — Other Ambulatory Visit (HOSPITAL_COMMUNITY)
Admission: RE | Admit: 2013-06-07 | Discharge: 2013-06-07 | Disposition: A | Discharge status: Home / Self Care | Payer: BC Managed Care – PPO | Source: Ambulatory Visit | Attending: Obstetrics & Gynecology | Admitting: Obstetrics & Gynecology

## 2013-06-07 ENCOUNTER — Other Ambulatory Visit: Payer: Self-pay | Admitting: Obstetrics & Gynecology

## 2013-06-07 DIAGNOSIS — Z01419 Encounter for gynecological examination (general) (routine) without abnormal findings: Secondary | ICD-10-CM | POA: Insufficient documentation

## 2013-06-09 ENCOUNTER — Ambulatory Visit: Payer: BC Managed Care – PPO | Admitting: Obstetrics & Gynecology

## 2013-06-23 ENCOUNTER — Encounter: Payer: Self-pay | Admitting: *Deleted

## 2013-06-28 ENCOUNTER — Encounter: Payer: Self-pay | Admitting: *Deleted

## 2013-06-30 ENCOUNTER — Emergency Department (HOSPITAL_BASED_OUTPATIENT_CLINIC_OR_DEPARTMENT_OTHER): Payer: BC Managed Care – PPO

## 2013-06-30 ENCOUNTER — Encounter (HOSPITAL_BASED_OUTPATIENT_CLINIC_OR_DEPARTMENT_OTHER): Payer: Self-pay | Admitting: Emergency Medicine

## 2013-06-30 ENCOUNTER — Emergency Department (HOSPITAL_BASED_OUTPATIENT_CLINIC_OR_DEPARTMENT_OTHER)
Admission: EM | Admit: 2013-06-30 | Discharge: 2013-06-30 | Disposition: A | Discharge status: Home / Self Care | Payer: BC Managed Care – PPO | Attending: Emergency Medicine | Admitting: Emergency Medicine

## 2013-06-30 DIAGNOSIS — Z791 Long term (current) use of non-steroidal anti-inflammatories (NSAID): Secondary | ICD-10-CM | POA: Insufficient documentation

## 2013-06-30 DIAGNOSIS — J45901 Unspecified asthma with (acute) exacerbation: Secondary | ICD-10-CM | POA: Insufficient documentation

## 2013-06-30 DIAGNOSIS — J45909 Unspecified asthma, uncomplicated: Secondary | ICD-10-CM

## 2013-06-30 DIAGNOSIS — Z872 Personal history of diseases of the skin and subcutaneous tissue: Secondary | ICD-10-CM | POA: Insufficient documentation

## 2013-06-30 DIAGNOSIS — Z880 Allergy status to penicillin: Secondary | ICD-10-CM | POA: Insufficient documentation

## 2013-06-30 DIAGNOSIS — Z79899 Other long term (current) drug therapy: Secondary | ICD-10-CM | POA: Insufficient documentation

## 2013-06-30 DIAGNOSIS — Z87440 Personal history of urinary (tract) infections: Secondary | ICD-10-CM | POA: Insufficient documentation

## 2013-06-30 DIAGNOSIS — Z792 Long term (current) use of antibiotics: Secondary | ICD-10-CM | POA: Insufficient documentation

## 2013-06-30 DIAGNOSIS — I10 Essential (primary) hypertension: Secondary | ICD-10-CM | POA: Insufficient documentation

## 2013-06-30 MED ORDER — PREDNISONE 50 MG PO TABS
60.0000 mg | ORAL_TABLET | Freq: Once | ORAL | Status: AC
  Administered 2013-06-30: 60 mg via ORAL
  Filled 2013-06-30 (×2): qty 1

## 2013-06-30 MED ORDER — ALBUTEROL SULFATE (2.5 MG/3ML) 0.083% IN NEBU
2.5000 mg | INHALATION_SOLUTION | Freq: Once | RESPIRATORY_TRACT | Status: AC
  Administered 2013-06-30: 2.5 mg via RESPIRATORY_TRACT
  Filled 2013-06-30: qty 3

## 2013-06-30 MED ORDER — ALBUTEROL SULFATE HFA 108 (90 BASE) MCG/ACT IN AERS
2.0000 | INHALATION_SPRAY | RESPIRATORY_TRACT | Status: DC
  Administered 2013-06-30: 2 via RESPIRATORY_TRACT
  Filled 2013-06-30: qty 6.7

## 2013-06-30 MED ORDER — PREDNISONE 20 MG PO TABS: 60.0000 mg | ORAL_TABLET | Freq: Every day | ORAL | Status: DC

## 2013-06-30 NOTE — ED Provider Notes (Signed)
CSN: [id number]     Arrival date & time 06/30/13  1639 History   First MD Initiated Contact with Patient 06/30/13 1657     Chief Complaint  Patient presents with  . Cough     (Consider location/radiation/quality/duration/timing/severity/associated sxs/prior Treatment) HPI Comments: Patient here with worsening cough and wheezing - she states that she had a cold about 3 weeks ago - states that over the past 3 days here with worsening cough and noticeable wheezing.  She reports chills but no fever, states some mild chest tightness with the wheezing but no pain and no shortness of breath.  She denies runny nose, sore throat, abdominal pain.  Patient is a 28 y.o. female presenting with cough. The history is provided by the patient. No language interpreter was used.  Cough Cough characteristics:  Non-productive Severity:  Moderate Onset quality:  Gradual Duration:  3 days Timing:  Constant Progression:  Worsening Chronicity:  New Smoker: no   Context: upper respiratory infection   Context: not exposure to allergens, not fumes, not sick contacts, not weather changes and not with activity   Relieved by:  Nothing Worsened by:  Nothing tried Ineffective treatments:  Rest and cough suppressants Associated symptoms: wheezing   Associated symptoms: no chest pain, no chills, no ear fullness, no eye discharge, no fever, no headaches, no rash, no rhinorrhea, no shortness of breath, no sinus congestion and no sore throat   Risk factors: no chemical exposure, no recent infection and no recent travel     Past Medical History  Diagnosis Date  . UTI (lower urinary tract infection)   . Eczema   . Hypertension     was taken off meds in 2011   Past Surgical History  Procedure Laterality Date  . Foot surgery    . Foot surgery    . Wisdom tooth extraction  2006   Family History  Problem Relation Age of Onset  . Cancer Mother     breast  . Diabetes Maternal Grandmother   . Diabetes Maternal  Grandfather   . Heart disease Father    History  Substance Use Topics  . Smoking status: Never Smoker   . Smokeless tobacco: Never Used  . Alcohol Use: No     Comment: occas use before pregnancy   OB History   Grav Para Term Preterm Abortions TAB SAB Ect Mult Living   2    2 1 1         Review of Systems  Constitutional: Negative for fever and chills.  HENT: Negative for rhinorrhea and sore throat.   Eyes: Negative for discharge.  Respiratory: Positive for cough and wheezing. Negative for shortness of breath.   Cardiovascular: Negative for chest pain.  Skin: Negative for rash.  Neurological: Negative for headaches.  All other systems reviewed and are negative.      Allergies  Penicillins  Home Medications   Current Outpatient Rx  Name  Route  Sig  Dispense  Refill  . guaiFENesin (MUCINEX) 600 MG 12 hr tablet   Oral   Take 1,200 mg by mouth 2 (two) times daily.         Marland Kitchen guaiFENesin-dextromethorphan (ROBITUSSIN DM) 100-10 MG/5ML syrup   Oral   Take 5 mLs by mouth every 4 (four) hours as needed for cough.         Marland Kitchen ibuprofen (ADVIL,MOTRIN) 800 MG tablet   Oral   Take 1 tablet (800 mg total) by mouth 3 (three) times daily.  21 tablet   2   . metroNIDAZOLE (FLAGYL) 500 MG tablet   Oral   Take 1 tablet (500 mg total) by mouth 2 (two) times daily. Do not drink alcohol with this medication.   14 tablet   0   . misoprostol (CYTOTEC) 200 MCG tablet      Place 4 tab (800 mcg) in the vagina once   4 tablet   0   . oxyCODONE-acetaminophen (PERCOCET/ROXICET) 5-325 MG per tablet   Oral   Take 1-2 tablets by mouth every 4 (four) hours as needed for severe pain.   20 tablet   0   . promethazine (PHENERGAN) 25 MG tablet   Oral   Take 1 tablet (25 mg total) by mouth every 6 (six) hours as needed for nausea or vomiting.   30 tablet   0    BP 173/91  Pulse 75  Temp(Src) 99.5 F (37.5 C) (Oral)  Resp 20  Ht 5\' 5"  (1.651 m)  Wt 215 lb (97.523 kg)  BMI  35.78 kg/m2  SpO2 99%  LMP 06/18/2013 Physical Exam  Nursing note and vitals reviewed. Constitutional: She is oriented to person, place, and time. She appears well-developed and well-nourished. No distress.  HENT:  Head: Normocephalic and atraumatic.  Right Ear: External ear normal.  Left Ear: External ear normal.  Nose: Nose normal.  Mouth/Throat: Oropharynx is clear and moist. No oropharyngeal exudate.  Eyes: Conjunctivae are normal. Pupils are equal, round, and reactive to light. No scleral icterus.  Neck: Normal range of motion. Neck supple.  Cardiovascular: Normal rate, regular rhythm and normal heart sounds.  Exam reveals no gallop and no friction rub.   No murmur heard. Pulmonary/Chest: Effort normal. No respiratory distress. She has wheezes. She has no rales. She exhibits no tenderness.  Diffuse basilar expiratory wheezing noted.  Abdominal: Soft. Bowel sounds are normal. She exhibits no distension. There is no tenderness. There is no rebound and no guarding.  Musculoskeletal: Normal range of motion. She exhibits no edema and no tenderness.  Lymphadenopathy:    She has no cervical adenopathy.  Neurological: She is alert and oriented to person, place, and time. She exhibits normal muscle tone. Coordination normal.  Skin: Skin is warm and dry. No rash noted. No erythema. No pallor.  Psychiatric: She has a normal mood and affect. Her behavior is normal. Judgment and thought content normal.    ED Course  Procedures (including critical care time) Labs Review Labs Reviewed - No data to display Imaging Review Dg Chest 2 View  06/30/2013   CLINICAL DATA:  Cough and congestion  EXAM: CHEST  2 VIEW  COMPARISON:  None.  FINDINGS: Lungs are clear. Heart size and pulmonary vascularity are normal. No adenopathy. No pneumothorax. No bone lesions. There is slight thoracic levoscoliosis.  IMPRESSION: No edema or consolidation.   Electronically Signed   By: Lowella Grip M.D.   On:  06/30/2013 17:02    EKG Interpretation   None      Medications  predniSONE (DELTASONE) tablet 60 mg (60 mg Oral Given 06/30/13 1743)  albuterol (PROVENTIL) (2.5 MG/3ML) 0.083% nebulizer solution 2.5 mg (2.5 mg Nebulization Given 06/30/13 1740)     MDM   Bronchitis  Patient here with wheezing, afebrile and non-productive cough - no evidence of PNA, I do not suspect PE as the patient is PERC negative - reports improvement in symptoms after breathing treatment.  Likely viral source.   Idalia Needle Joelyn Oms, [place] 06/30/13 1812

## 2013-06-30 NOTE — Discharge Instructions (Signed)
Bronchitis Bronchitis is inflammation of the airways that extend from the windpipe into the lungs (bronchi). The inflammation often causes mucus to develop, which leads to a cough. If the inflammation becomes severe, it may cause shortness of breath. CAUSES  Bronchitis may be caused by:   Viral infections.   Bacteria.   Cigarette smoke.   Allergens, pollutants, and other irritants.  SIGNS AND SYMPTOMS  The most common symptom of bronchitis is a frequent cough that produces mucus. Other symptoms include:  Fever.   Body aches.   Chest congestion.   Chills.   Shortness of breath.   Sore throat.  DIAGNOSIS  Bronchitis is usually diagnosed through a medical history and physical exam. Tests, such as chest X-rays, are sometimes done to rule out other conditions.  TREATMENT  You may need to avoid contact with whatever caused the problem (smoking, for example). Medicines are sometimes needed. These may include:  Antibiotics. These may be prescribed if the condition is caused by bacteria.  Cough suppressants. These may be prescribed for relief of cough symptoms.   Inhaled medicines. These may be prescribed to help open your airways and make it easier for you to breathe.   Steroid medicines. These may be prescribed for those with recurrent (chronic) bronchitis. HOME CARE INSTRUCTIONS  Get plenty of rest.   Drink enough fluids to keep your urine clear or pale yellow (unless you have a medical condition that requires fluid restriction). Increasing fluids may help thin your secretions and will prevent dehydration.   Only take over-the-counter or prescription medicines as directed by your health care provider.  Only take antibiotics as directed. Make sure you finish them even if you start to feel better.  Avoid secondhand smoke, irritating chemicals, and strong fumes. These will make bronchitis worse. If you are a smoker, quit smoking. Consider using nicotine gum or  skin patches to help control withdrawal symptoms. Quitting smoking will help your lungs heal faster.   Put a cool-mist humidifier in your bedroom at night to moisten the air. This may help loosen mucus. Change the water in the humidifier daily. You can also run the hot water in your shower and sit in the bathroom with the door closed for 5 10 minutes.   Follow up with your health care provider as directed.   Wash your hands frequently to avoid catching bronchitis again or spreading an infection to others.  SEEK MEDICAL CARE IF: Your symptoms do not improve after 1 week of treatment.  SEEK IMMEDIATE MEDICAL CARE IF:  Your fever increases.  You have chills.   You have chest pain.   You have worsening shortness of breath.   You have bloody sputum.  You faint.  You have lightheadedness.  You have a severe headache.   You vomit repeatedly. MAKE SURE YOU:   Understand these instructions.  Will watch your condition.  Will get help right away if you are not doing well or get worse. Document Released: 04/27/2005 Document Revised: 02/15/2013 Document Reviewed: 12/20/2012 [facility] Patient Information 2014 [place].  Asthma, Adult Asthma is a recurring condition in which the airways tighten and narrow. Asthma can make it difficult to breathe. It can cause coughing, wheezing, and shortness of breath. Asthma episodes (also called asthma attacks) range from minor to life-threatening. Asthma cannot be cured, but medicines and lifestyle changes can help control it. CAUSES Asthma is believed to be caused by inherited (genetic) and environmental factors, but its exact cause is unknown. Asthma may be  triggered by allergens, lung infections, or irritants in the air. Asthma triggers are different for each person. Common triggers include:   Animal dander.  Dust mites.  Cockroaches.  Pollen from trees or grass.  Mold.  Smoke.  Air pollutants such as dust, household  cleaners, hair sprays, aerosol sprays, paint fumes, strong chemicals, or strong odors.  Cold air, weather changes, and winds (which increase molds and pollens in the air).  Strong emotional expressions such as crying or laughing hard.  Stress.  Certain medicines (such as aspirin) or types of drugs (such as beta-blockers).  Sulfites in foods and drinks. Foods and drinks that may contain sulfites include dried fruit, potato chips, and sparkling grape juice.  Infections or inflammatory conditions such as the flu, a cold, or an inflammation of the nasal membranes (rhinitis).  Gastroesophageal reflux disease (GERD).  Exercise or strenuous activity. SYMPTOMS Symptoms may occur immediately after asthma is triggered or many hours later. Symptoms include:  Wheezing.  Excessive nighttime or early morning coughing.  Frequent or severe coughing with a common cold.  Chest tightness.  Shortness of breath. DIAGNOSIS  The diagnosis of asthma is made by a review of your medical history and a physical exam. Tests may also be performed. These may include:  Lung function studies. These tests show how much air you breath in and out.  Allergy tests.  Imaging tests such as X-rays. TREATMENT  Asthma cannot be cured, but it can usually be controlled. Treatment involves identifying and avoiding your asthma triggers. It also involves medicines. There are 2 classes of medicine used for asthma treatment:   Controller medicines. These prevent asthma symptoms from occurring. They are usually taken every day.  Reliever or rescue medicines. These quickly relieve asthma symptoms. They are used as needed and provide short-term relief. Your health care provider will help you create an asthma action plan. An asthma action plan is a written plan for managing and treating your asthma attacks. It includes a list of your asthma triggers and how they may be avoided. It also includes information on when medicines  should be taken and when their dosage should be changed. An action plan may also involve the use of a device called a peak flow meter. A peak flow meter measures how well the lungs are working. It helps you monitor your condition. HOME CARE INSTRUCTIONS   Take medicine as directed by your health care provider. Speak with your health care provider if you have questions about how or when to take the medicines.  Use a peak flow meter as directed by your health care provider. Record and keep track of readings.  Understand and use the action plan to help minimize or stop an asthma attack without needing to seek medical care.  Control your home environment in the following ways to help prevent asthma attacks:  Do not smoke. Avoid being exposed to secondhand smoke.  Change your heating and air conditioning filter regularly.  Limit your use of fireplaces and wood stoves.  Get rid of pests (such as roaches and mice) and their droppings.  Throw away plants if you see mold on them.  Clean your floors and dust regularly. Use unscented cleaning products.  Try to have someone else vacuum for you regularly. Stay out of rooms while they are being vacuumed and for a short while afterward. If you vacuum, use a dust mask from a hardware store, a double-layered or microfilter vacuum cleaner bag, or a vacuum cleaner with a HEPA  filter.  Replace carpet with wood, tile, or vinyl flooring. Carpet can trap dander and dust.  Use allergy-proof pillows, mattress covers, and box spring covers.  Wash bed sheets and blankets every week in hot water and dry them in a dryer.  Use blankets that are made of polyester or cotton.  Clean bathrooms and kitchens with bleach. If possible, have someone repaint the walls in these rooms with mold-resistant paint. Keep out of the rooms that are being cleaned and painted.  Wash hands frequently. SEEK MEDICAL CARE IF:   You have wheezing, shortness of breath, or a cough even  if taking medicine to prevent attacks.  The colored mucus you cough up (sputum) is thicker than usual.  Your sputum changes from clear or white to yellow, green, gray, or bloody.  You have any problems that may be related to the medicines you are taking (such as a rash, itching, swelling, or trouble breathing).  You are using a reliever medicine more than 2 3 times per week.  Your peak flow is still at 50 79% of you personal best after following your action plan for 1 hour. SEEK IMMEDIATE MEDICAL CARE IF:   You seem to be getting worse and are unresponsive to treatment during an asthma attack.  You are short of breath even at rest.  You get short of breath when doing very little physical activity.  You have difficulty eating, drinking, or talking due to asthma symptoms.  You develop chest pain.  You develop a fast heartbeat.  You have a bluish color to your lips or fingernails.  You are lightheaded, dizzy, or faint.  Your peak flow is less than 50% of your personal best.  You have a fever or persistent symptoms for more than 2 3 days.  You have a fever and symptoms suddenly get worse. MAKE SURE YOU:   Understand these instructions.  Will watch your condition.  Will get help right away if you are not doing well or get worse. Document Released: 04/27/2005 Document Revised: 12/28/2012 Document Reviewed: 11/24/2012 [facility] Patient Information 2014 [place], [place].

## 2013-06-30 NOTE — ED Notes (Signed)
Pt states that she has had a cough for the past 3 days, more productive today.  Pt states that she had headcold 3 weeks ago.  C/o chest congestion.

## 2013-06-30 NOTE — ED Provider Notes (Signed)
Medical screening examination/treatment/procedure(s) were performed by non-physician practitioner and as supervising physician I was immediately available for consultation/collaboration.  EKG Interpretation   None         Ezequiel Essex, MD 06/30/13 2328

## 2013-07-18 ENCOUNTER — Emergency Department (HOSPITAL_BASED_OUTPATIENT_CLINIC_OR_DEPARTMENT_OTHER)
Admission: EM | Admit: 2013-07-18 | Discharge: 2013-07-18 | Disposition: A | Discharge status: Home / Self Care | Payer: BC Managed Care – PPO | Attending: Emergency Medicine | Admitting: Emergency Medicine

## 2013-07-18 ENCOUNTER — Encounter (HOSPITAL_BASED_OUTPATIENT_CLINIC_OR_DEPARTMENT_OTHER): Payer: Self-pay | Admitting: Emergency Medicine

## 2013-07-18 DIAGNOSIS — Z880 Allergy status to penicillin: Secondary | ICD-10-CM | POA: Insufficient documentation

## 2013-07-18 DIAGNOSIS — IMO0002 Reserved for concepts with insufficient information to code with codable children: Secondary | ICD-10-CM | POA: Insufficient documentation

## 2013-07-18 DIAGNOSIS — I10 Essential (primary) hypertension: Secondary | ICD-10-CM | POA: Insufficient documentation

## 2013-07-18 DIAGNOSIS — Z87440 Personal history of urinary (tract) infections: Secondary | ICD-10-CM | POA: Insufficient documentation

## 2013-07-18 DIAGNOSIS — Z79899 Other long term (current) drug therapy: Secondary | ICD-10-CM | POA: Insufficient documentation

## 2013-07-18 DIAGNOSIS — Z791 Long term (current) use of non-steroidal anti-inflammatories (NSAID): Secondary | ICD-10-CM | POA: Insufficient documentation

## 2013-07-18 DIAGNOSIS — K0381 Cracked tooth: Secondary | ICD-10-CM | POA: Insufficient documentation

## 2013-07-18 DIAGNOSIS — Z872 Personal history of diseases of the skin and subcutaneous tissue: Secondary | ICD-10-CM | POA: Insufficient documentation

## 2013-07-18 DIAGNOSIS — K029 Dental caries, unspecified: Secondary | ICD-10-CM | POA: Insufficient documentation

## 2013-07-18 MED ORDER — CLINDAMYCIN HCL 150 MG PO CAPS
300.0000 mg | ORAL_CAPSULE | Freq: Once | ORAL | Status: AC
  Administered 2013-07-18: 300 mg via ORAL
  Filled 2013-07-18: qty 2

## 2013-07-18 MED ORDER — CLINDAMYCIN HCL 300 MG PO CAPS: 300.0000 mg | ORAL_CAPSULE | Freq: Four times a day (QID) | ORAL | Status: DC

## 2013-07-18 MED ORDER — OXYCODONE-ACETAMINOPHEN 5-325 MG PO TABS: 1.0000 | ORAL_TABLET | Freq: Four times a day (QID) | ORAL | Status: DC | PRN

## 2013-07-18 NOTE — Discharge Instructions (Signed)
Dental Care and Dentist Visits Dental care supports good overall health. Regular dental visits can also help you avoid dental pain, bleeding, infection, and other more serious health problems in the future. It is important to keep the mouth healthy because diseases in the teeth, gums, and other oral tissues can spread to other areas of the body. Some problems, such as diabetes, heart disease, and pre-term labor have been associated with poor oral health.  See your dentist every 6 months. If you experience emergency problems such as a toothache or broken tooth, go to the dentist right away. If you see your dentist regularly, you may catch problems early. It is easier to be treated for problems in the early stages.  WHAT TO EXPECT AT A DENTIST VISIT  Your dentist will look for many common oral health problems and recommend proper treatment. At your regular dental visit, you can expect:  Gentle cleaning of the teeth and gums. This includes scraping and polishing. This helps to remove the sticky substance around the teeth and gums (plaque). Plaque forms in the mouth shortly after eating. Over time, plaque hardens on the teeth as tartar. If tartar is not removed regularly, it can cause problems. Cleaning also helps remove stains.  Periodic X-rays. These pictures of the teeth and supporting bone will help your dentist assess the health of your teeth.  Periodic fluoride treatments. Fluoride is a natural mineral shown to help strengthen teeth. Fluoride treatmentinvolves applying a fluoride gel or varnish to the teeth. It is most commonly done in children.  Examination of the mouth, tongue, jaws, teeth, and gums to look for any oral health problems, such as:  Cavities (dental caries). This is decay on the tooth caused by plaque, sugar, and acid in the mouth. It is best to catch a cavity when it is small.  Inflammation of the gums caused by plaque buildup (gingivitis).  Problems with the mouth or malformed  or misaligned teeth.  Oral cancer or other diseases of the soft tissues or jaws. KEEP YOUR TEETH AND GUMS HEALTHY For healthy teeth and gums, follow these general guidelines as well as your dentist's specific advice:  Have your teeth professionally cleaned at the dentist every 6 months.  Brush twice daily with a fluoride toothpaste.  Floss your teeth daily.  Ask your dentist if you need fluoride supplements, treatments, or fluoride toothpaste.  Eat a healthy diet. Reduce foods and drinks with added sugar.  Avoid smoking. TREATMENT FOR ORAL HEALTH PROBLEMS If you have oral health problems, treatment varies depending on the conditions present in your teeth and gums.  Your caregiver will most likely recommend good oral hygiene at each visit.  For cavities, gingivitis, or other oral health disease, your caregiver will perform a procedure to treat the problem. This is typically done at a separate appointment. Sometimes your caregiver will refer you to another dental specialist for specific tooth problems or for surgery. SEEK IMMEDIATE DENTAL CARE IF:  You have pain, bleeding, or soreness in the gum, tooth, jaw, or mouth area.  A permanent tooth becomes loose or separated from the gum socket.  You experience a blow or injury to the mouth or jaw area. Document Released: 01/07/2011 Document Revised: 07/20/2011 Document Reviewed: 01/07/2011 [employer] Patient Information 2014 [employer].  

## 2013-07-18 NOTE — ED Provider Notes (Signed)
CSN: [id number]     Arrival date & time 07/18/13  0450 History   First MD Initiated Contact with Patient 07/18/13 0455     Chief Complaint  Patient presents with  . Dental Pain     (Consider location/radiation/quality/duration/timing/severity/associated sxs/prior Treatment) Patient is a 28 y.o. female presenting with tooth pain. The history is provided by the patient. No language interpreter was used.  Dental Pain Location:  Upper Upper teeth location:  2/RU 2nd molar and 3/RU 1st molar Quality:  Throbbing Severity:  Severe Onset quality:  Gradual Timing:  Constant Progression:  Unchanged Chronicity:  New Context: crown fracture and dental caries   Previous work-up:  Dental exam and filled cavity Relieved by:  Nothing Worsened by:  Nothing tried Ineffective treatments:  NSAIDs Associated symptoms: no neck swelling and no trismus   Risk factors: no smoking     Past Medical History  Diagnosis Date  . UTI (lower urinary tract infection)   . Eczema   . Hypertension     was taken off meds in 2011   Past Surgical History  Procedure Laterality Date  . Foot surgery    . Foot surgery    . Wisdom tooth extraction  2006   Family History  Problem Relation Age of Onset  . Cancer Mother     breast  . Diabetes Maternal Grandmother   . Diabetes Maternal Grandfather   . Heart disease Father    History  Substance Use Topics  . Smoking status: Never Smoker   . Smokeless tobacco: Never Used  . Alcohol Use: No     Comment: occas use before pregnancy   OB History   Grav Para Term Preterm Abortions TAB SAB Ect Mult Living   2    2 1 1         Review of Systems  All other systems reviewed and are negative.      Allergies  Penicillins  Home Medications   Current Outpatient Rx  Name  Route  Sig  Dispense  Refill  . clindamycin (CLEOCIN) 300 MG capsule   Oral   Take 1 capsule (300 mg total) by mouth 4 (four) times daily. X 7 days   28 capsule   0   . guaiFENesin  (MUCINEX) 600 MG 12 hr tablet   Oral   Take 1,200 mg by mouth 2 (two) times daily.         Marland Kitchen guaiFENesin-dextromethorphan (ROBITUSSIN DM) 100-10 MG/5ML syrup   Oral   Take 5 mLs by mouth every 4 (four) hours as needed for cough.         Marland Kitchen ibuprofen (ADVIL,MOTRIN) 800 MG tablet   Oral   Take 1 tablet (800 mg total) by mouth 3 (three) times daily.   21 tablet   2   . metroNIDAZOLE (FLAGYL) 500 MG tablet   Oral   Take 1 tablet (500 mg total) by mouth 2 (two) times daily. Do not drink alcohol with this medication.   14 tablet   0   . misoprostol (CYTOTEC) 200 MCG tablet      Place 4 tab (800 mcg) in the vagina once   4 tablet   0   . oxyCODONE-acetaminophen (PERCOCET) 5-325 MG per tablet   Oral   Take 1 tablet by mouth every 6 (six) hours as needed.   11 tablet   0   . oxyCODONE-acetaminophen (PERCOCET/ROXICET) 5-325 MG per tablet   Oral   Take 1-2 tablets by mouth every  4 (four) hours as needed for severe pain.   20 tablet   0   . predniSONE (DELTASONE) 20 MG tablet   Oral   Take 3 tablets (60 mg total) by mouth daily with breakfast.   15 tablet   0   . promethazine (PHENERGAN) 25 MG tablet   Oral   Take 1 tablet (25 mg total) by mouth every 6 (six) hours as needed for nausea or vomiting.   30 tablet   0    BP 148/82  Pulse 102  Temp(Src) 98.7 F (37.1 C) (Oral)  Resp 18  Ht 5' 4.5" (1.638 m)  Wt 215 lb (97.523 kg)  BMI 36.35 kg/m2  SpO2 100%  LMP 06/18/2013 Physical Exam  Constitutional: She is oriented to person, place, and time. She appears well-developed and well-nourished. No distress.  HENT:  Head: Normocephalic and atraumatic.  Mouth/Throat: Oropharynx is clear and moist.    Eyes: Conjunctivae are normal. Pupils are equal, round, and reactive to light.  Neck: Normal range of motion. Neck supple.  Cardiovascular: Normal rate and regular rhythm.   Pulmonary/Chest: Effort normal and breath sounds normal.  Abdominal: Soft. Bowel sounds are  normal. There is no tenderness.  Musculoskeletal: Normal range of motion.  Neurological: She is alert and oriented to person, place, and time.  Skin: Skin is warm and dry.  Psychiatric: She has a normal mood and affect.    ED Course  Procedures (including critical care time) Labs Review Labs Reviewed - No data to display Imaging Review No results found.   EKG Interpretation None      MDM   Final diagnoses:  Dental caries  diffuse dental disease.  No signs of abscess but exposed root  Will give rx for clindamycin and pain medication.  Will provide referral to a local dentist.      Sala Tague K Pranay Hilbun-Rasch, MD 07/18/13 3536

## 2013-07-18 NOTE — ED Notes (Signed)
Dental pain to right upper teeth

## 2014-03-02 IMAGING — CR DG CHEST 2V
2 series · 2 of 2 positions shown · non-contrast
Comparison: None.

CLINICAL DATA: Cough and congestion

EXAM:
CHEST  2 VIEW

[w chest pa]
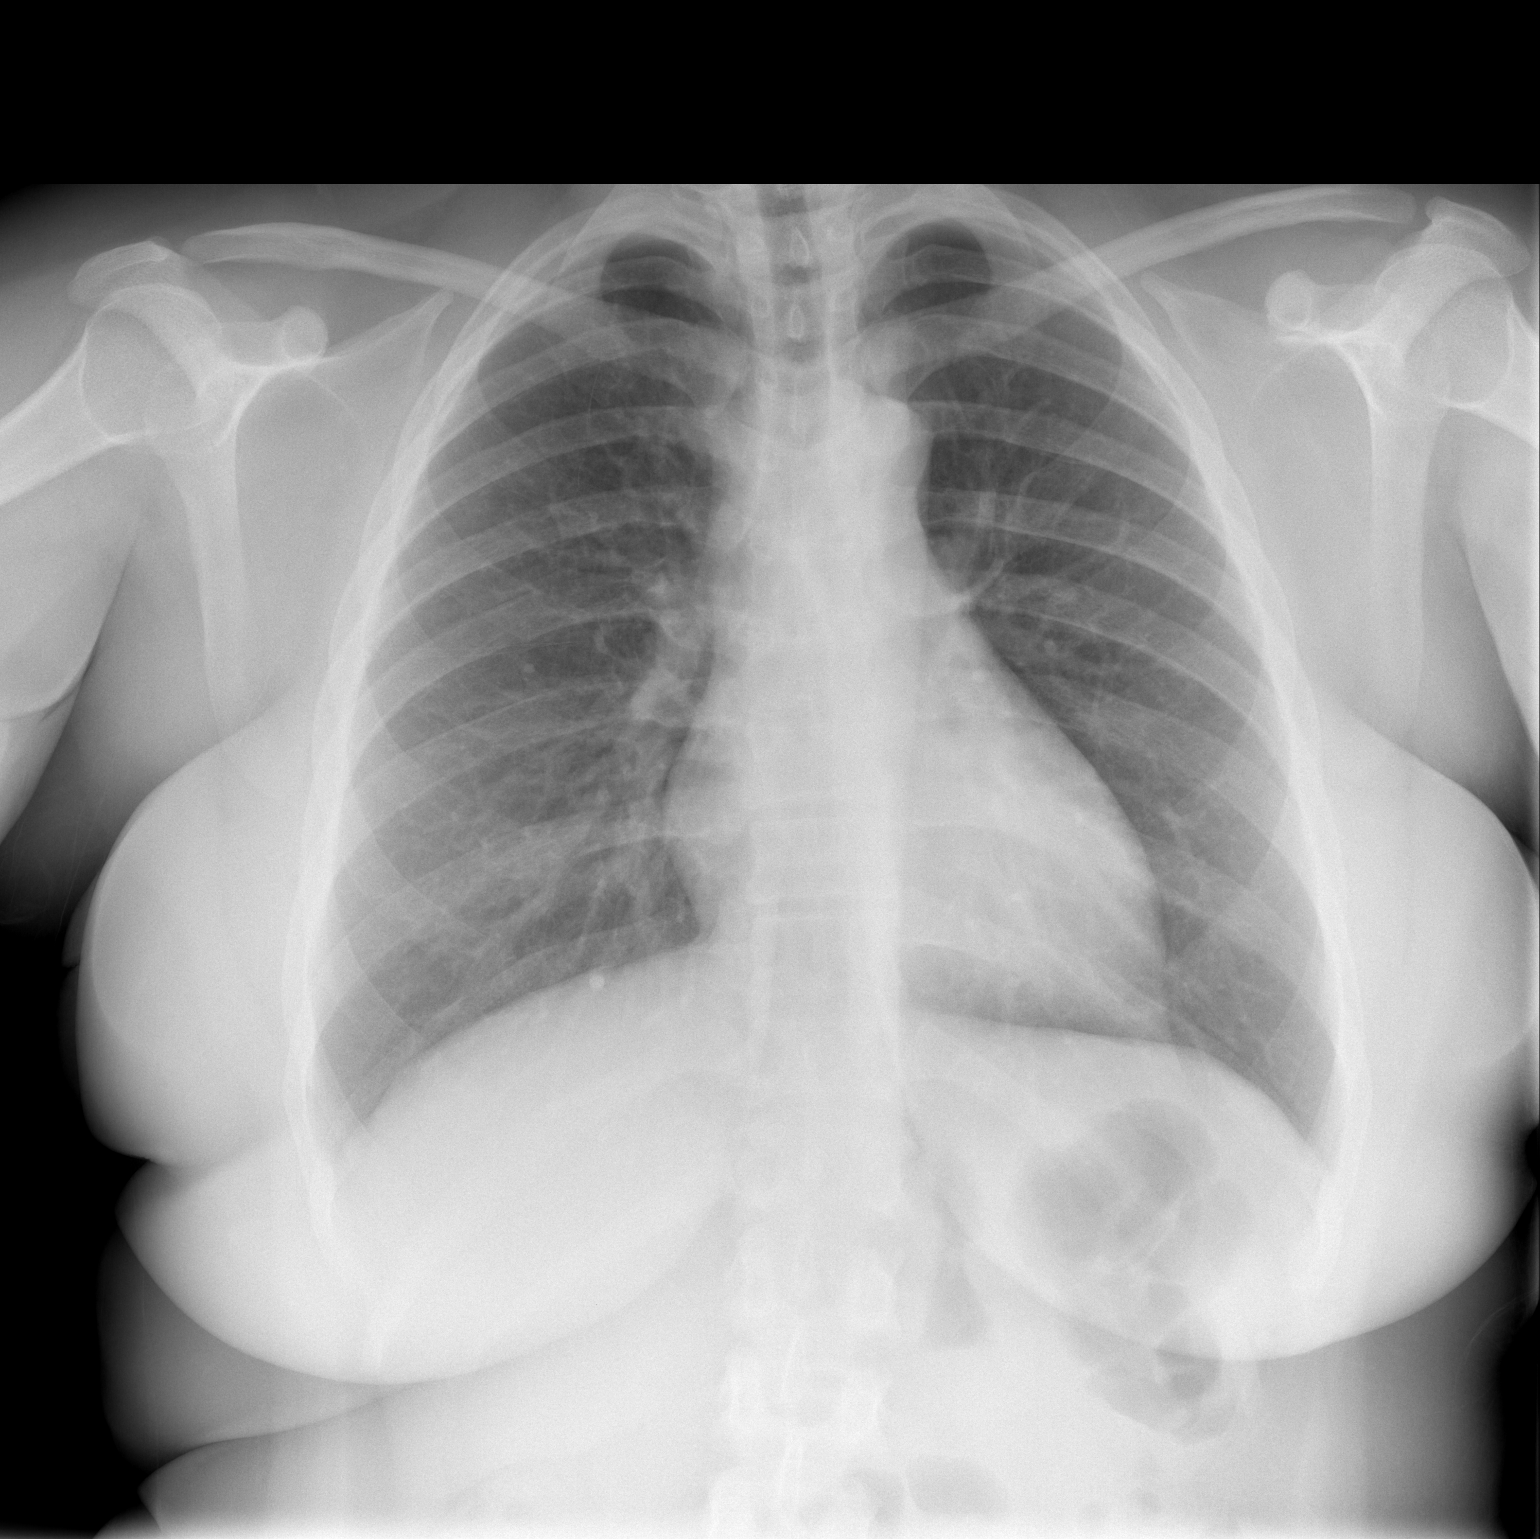

[w chest lat]
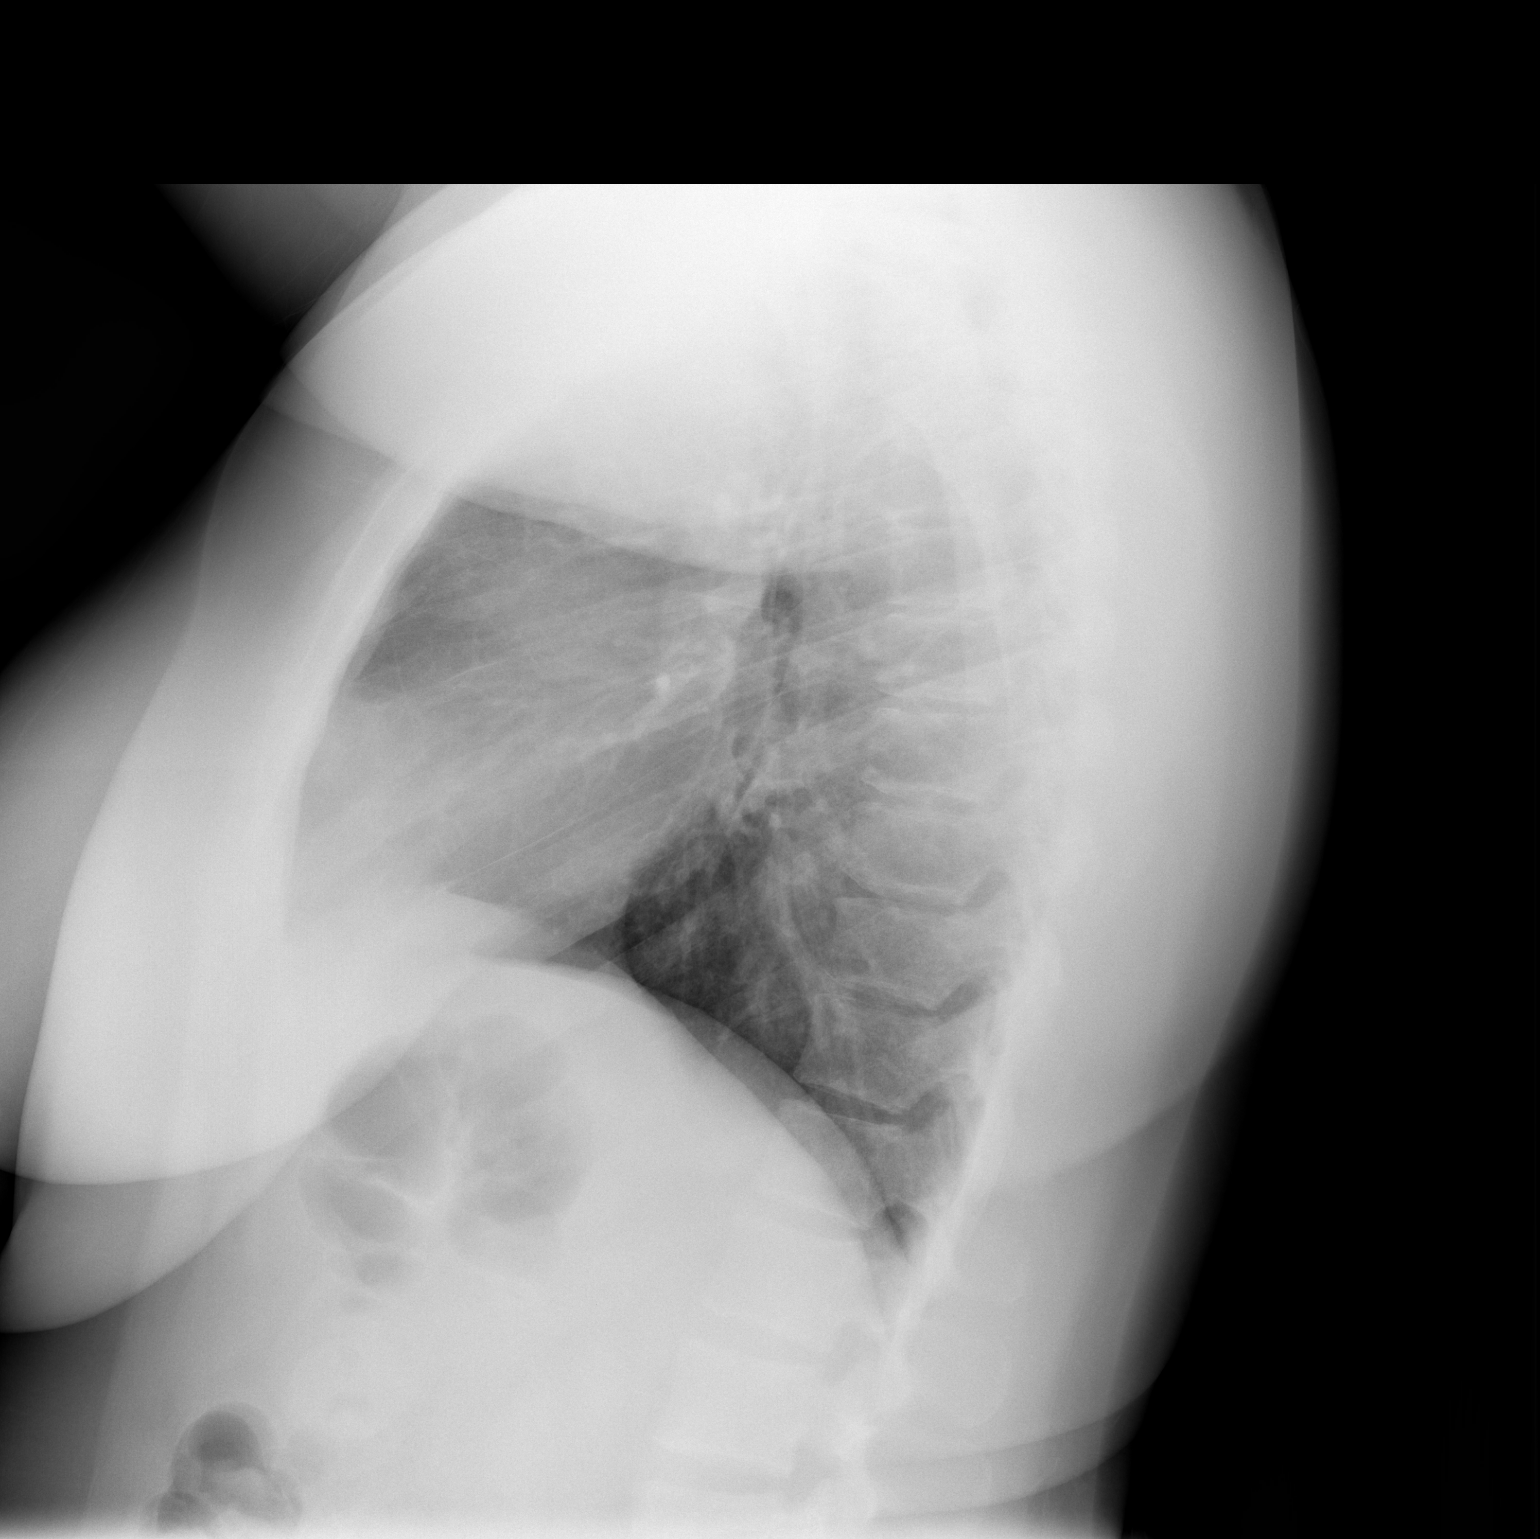

[2 of 2 positions shown; findings below may reference images not displayed]

FINDINGS: Lungs are clear. Heart size and pulmonary vascularity are normal. No
adenopathy. No pneumothorax. No bone lesions. There is slight
thoracic levoscoliosis.
IMPRESSION: No edema or consolidation.

## 2014-03-12 ENCOUNTER — Encounter (HOSPITAL_BASED_OUTPATIENT_CLINIC_OR_DEPARTMENT_OTHER): Payer: Self-pay | Admitting: Emergency Medicine

## 2015-10-09 ENCOUNTER — Emergency Department (HOSPITAL_BASED_OUTPATIENT_CLINIC_OR_DEPARTMENT_OTHER)
Admission: EM | Admit: 2015-10-09 | Discharge: 2015-10-09 | Disposition: A | Discharge status: Home / Self Care | Payer: BLUE CROSS/BLUE SHIELD | Attending: Emergency Medicine | Admitting: Emergency Medicine

## 2015-10-09 ENCOUNTER — Emergency Department (HOSPITAL_BASED_OUTPATIENT_CLINIC_OR_DEPARTMENT_OTHER): Payer: BLUE CROSS/BLUE SHIELD

## 2015-10-09 ENCOUNTER — Encounter (HOSPITAL_BASED_OUTPATIENT_CLINIC_OR_DEPARTMENT_OTHER): Payer: Self-pay | Admitting: *Deleted

## 2015-10-09 DIAGNOSIS — Y929 Unspecified place or not applicable: Secondary | ICD-10-CM | POA: Diagnosis not present

## 2015-10-09 DIAGNOSIS — S9032XA Contusion of left foot, initial encounter: Secondary | ICD-10-CM | POA: Diagnosis not present

## 2015-10-09 DIAGNOSIS — Y999 Unspecified external cause status: Secondary | ICD-10-CM | POA: Diagnosis not present

## 2015-10-09 DIAGNOSIS — W208XXA Other cause of strike by thrown, projected or falling object, initial encounter: Secondary | ICD-10-CM | POA: Insufficient documentation

## 2015-10-09 DIAGNOSIS — I10 Essential (primary) hypertension: Secondary | ICD-10-CM | POA: Insufficient documentation

## 2015-10-09 DIAGNOSIS — Y939 Activity, unspecified: Secondary | ICD-10-CM | POA: Diagnosis not present

## 2015-10-09 DIAGNOSIS — S99922A Unspecified injury of left foot, initial encounter: Secondary | ICD-10-CM | POA: Diagnosis present

## 2015-10-09 MED ORDER — HYDROCODONE-ACETAMINOPHEN 5-325 MG PO TABS: 2.0000 | ORAL_TABLET | Freq: Four times a day (QID) | ORAL | Status: AC | PRN

## 2015-10-09 NOTE — Discharge Instructions (Signed)

## 2015-10-09 NOTE — ED Notes (Addendum)
Pt c/o left foot injury x 1 hr ago dropped heavy object on foot

## 2015-10-09 NOTE — ED Provider Notes (Signed)
CSN: [id number]     Arrival date & time 10/09/15  2300 History   By signing my name below, I, Jasmyn B. Alexander, attest that this documentation has been prepared under the direction and in the presence of Shanon Rosser, MD.  Electronically Signed: Tedra Coupe. Sheppard Coil, ED Scribe. 10/09/2015. 11:28 PM.  Chief Complaint  Patient presents with  . Foot Injury    The history is provided by the patient. No language interpreter was used.    HPI Comments: Jamie Greene is a 30 y.o. female who presents to the Emergency Department complaining of 7/10 dorsal left foot pain after a large [place] salt lamp fell on left foot about an hour and a half ago. Pain is worse with ambulation, palpation or movement. There is associated ecchymosis but no deformity. She took an 800mg  Ibuprofen for pain with no relief. Pt is able to ambulate but notes exacerbated pain while walking. She denies any other injury.  Past Medical History  Diagnosis Date  . UTI (lower urinary tract infection)   . Eczema   . Hypertension     was taken off meds in 2011   Past Surgical History  Procedure Laterality Date  . Foot surgery    . Foot surgery    . Wisdom tooth extraction  2006   Family History  Problem Relation Age of Onset  . Cancer Mother     breast  . Diabetes Maternal Grandmother   . Diabetes Maternal Grandfather   . Heart disease Father    Social History  Substance Use Topics  . Smoking status: Never Smoker   . Smokeless tobacco: Never Used  . Alcohol Use: No     Comment: occas use before pregnancy   OB History    Gravida Para Term Preterm AB TAB SAB Ectopic Multiple Living   2    2 1 1         Review of Systems  All other systems reviewed and are negative.  Allergies  Penicillins  Home Medications   Prior to Admission medications   Medication Sig Start Date End Date Taking? Authorizing Provider  guaiFENesin-dextromethorphan (ROBITUSSIN DM) 100-10 MG/5ML syrup Take 5 mLs by mouth every 4  (four) hours as needed for cough.    Historical Provider, MD  oxyCODONE-acetaminophen (PERCOCET/ROXICET) 5-325 MG per tablet Take 1-2 tablets by mouth every 4 (four) hours as needed for severe pain. 05/02/13   Luvenia Redden, PA-C  promethazine (PHENERGAN) 25 MG tablet Take 1 tablet (25 mg total) by mouth every 6 (six) hours as needed for nausea or vomiting. 05/01/13   Olegario Messier, NP   BP 149/108 mmHg  Pulse 104  Temp(Src) 98.5 F (36.9 C)  Resp 18  SpO2 100%   Physical Exam  Nursing note and vitals reviewed. General: Well-developed, well-nourished female in no acute distress; appearance consistent with age of record HENT: normocephalic; atraumatic Eyes: pupils equal, round and reactive to light; extraocular muscles intact Neck: supple Heart: regular rate and rhythm Lungs: clear to auscultation bilaterally Abdomen: soft; nondistended; nontender; bowel sounds present Extremities: No deformity; full range of motion; pulses normal; ecchymosis and tenderness of the dorsal left foot, left foot distally neurovascularly intact with intact tendon function. Neurologic: Awake, alert and oriented; motor function intact in all extremities and symmetric; no facial droop. Skin: Warm and dry Psychiatric: Normal mood and affect  ED Course  Procedures (including critical care time)  MDM  Nursing notes and vitals signs, including pulse oximetry, reviewed.  Summary of this visit's results, reviewed by myself:  Imaging Studies: Dg Foot Complete Left  10/09/2015  CLINICAL DATA:  Dropped heavy object on left foot, with pain at the distal third, fourth and fifth metatarsals. Initial encounter. EXAM: LEFT FOOT - COMPLETE 3+ VIEW COMPARISON:  None. FINDINGS: There is no evidence of fracture or dislocation. The joint spaces are preserved. There is no evidence of talar subluxation; the subtalar joint is unremarkable in appearance. Mild dorsal soft tissue swelling is noted at the ankle. IMPRESSION: No  evidence of fracture or dislocation. Electronically Signed   By: Garald Balding M.D.   On: 10/09/2015 23:31     Final diagnoses:  Contusion of left foot, initial encounter   I personally performed the services described in this documentation, which was scribed in my presence. The recorded information has been reviewed and is accurate.     Shanon Rosser, MD 10/09/15 [phone]

## 2015-10-09 NOTE — ED Notes (Signed)
Patient transported to X-ray 

## 2015-10-09 NOTE — ED Notes (Signed)
Pt verbalizes understanding of d/c instructions and denies any further need at this time.
